# Patient Record
Sex: Male | Born: 1975 | Race: White | Hispanic: No
Health system: Southern US, Academic
[De-identification: ages and names within clinical notes are randomized; demographics above are authoritative.]

## PROBLEM LIST (undated history)

## (undated) ENCOUNTER — Ambulatory Visit

## (undated) ENCOUNTER — Telehealth

## (undated) ENCOUNTER — Ambulatory Visit
Attending: Pharmacist Clinician (PhC)/ Clinical Pharmacy Specialist | Primary: Pharmacist Clinician (PhC)/ Clinical Pharmacy Specialist

## (undated) ENCOUNTER — Ambulatory Visit: Attending: Family | Primary: Family

## (undated) DIAGNOSIS — S80819A Abrasion, unspecified lower leg, initial encounter: Secondary | ICD-10-CM

## (undated) DIAGNOSIS — M1990 Unspecified osteoarthritis, unspecified site: Secondary | ICD-10-CM

## (undated) DIAGNOSIS — F419 Anxiety disorder, unspecified: Secondary | ICD-10-CM

## (undated) DIAGNOSIS — Z98811 Dental restoration status: Secondary | ICD-10-CM

## (undated) DIAGNOSIS — F329 Major depressive disorder, single episode, unspecified: Secondary | ICD-10-CM

## (undated) DIAGNOSIS — F32A Depression, unspecified: Secondary | ICD-10-CM

## (undated) DIAGNOSIS — K219 Gastro-esophageal reflux disease without esophagitis: Secondary | ICD-10-CM

## (undated) DIAGNOSIS — I998 Other disorder of circulatory system: Secondary | ICD-10-CM

## (undated) DIAGNOSIS — F319 Bipolar disorder, unspecified: Secondary | ICD-10-CM

## (undated) DIAGNOSIS — S62609K Fracture of unspecified phalanx of unspecified finger, subsequent encounter for fracture with nonunion: Secondary | ICD-10-CM

## (undated) DIAGNOSIS — Z972 Presence of dental prosthetic device (complete) (partial): Secondary | ICD-10-CM

## (undated) HISTORY — PX: ORIF ANKLE FRACTURE: SUR919

---
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
## 2011-08-30 ENCOUNTER — Encounter (HOSPITAL_COMMUNITY): Payer: Self-pay | Admitting: Certified Registered Nurse Anesthetist

## 2011-08-30 ENCOUNTER — Inpatient Hospital Stay (HOSPITAL_COMMUNITY)
Admission: EM | Admit: 2011-08-30 | Discharge: 2011-09-03 | DRG: 902 | Disposition: A | Discharge status: Home / Self Care | Payer: Worker's Compensation | Attending: Orthopedic Surgery | Admitting: Orthopedic Surgery

## 2011-08-30 ENCOUNTER — Encounter (HOSPITAL_COMMUNITY)
Admission: EM | Disposition: A | Discharge status: Home / Self Care | Payer: Self-pay | Source: Home / Self Care | Attending: Orthopedic Surgery

## 2011-08-30 ENCOUNTER — Encounter (HOSPITAL_COMMUNITY): Payer: Self-pay | Admitting: *Deleted

## 2011-08-30 ENCOUNTER — Emergency Department (HOSPITAL_COMMUNITY): Payer: Worker's Compensation | Admitting: Certified Registered Nurse Anesthetist

## 2011-08-30 ENCOUNTER — Emergency Department (HOSPITAL_COMMUNITY): Payer: Worker's Compensation

## 2011-08-30 DIAGNOSIS — IMO0002 Reserved for concepts with insufficient information to code with codable children: Secondary | ICD-10-CM | POA: Diagnosis present

## 2011-08-30 DIAGNOSIS — S62639B Displaced fracture of distal phalanx of unspecified finger, initial encounter for open fracture: Secondary | ICD-10-CM | POA: Diagnosis present

## 2011-08-30 DIAGNOSIS — Y9269 Other specified industrial and construction area as the place of occurrence of the external cause: Secondary | ICD-10-CM

## 2011-08-30 DIAGNOSIS — F17200 Nicotine dependence, unspecified, uncomplicated: Secondary | ICD-10-CM | POA: Diagnosis present

## 2011-08-30 DIAGNOSIS — S6710XA Crushing injury of unspecified finger(s), initial encounter: Principal | ICD-10-CM | POA: Diagnosis present

## 2011-08-30 DIAGNOSIS — W240XXA Contact with lifting devices, not elsewhere classified, initial encounter: Secondary | ICD-10-CM | POA: Diagnosis present

## 2011-08-30 DIAGNOSIS — S6720XA Crushing injury of unspecified hand, initial encounter: Secondary | ICD-10-CM

## 2011-08-30 HISTORY — PX: AMPUTATION: SHX166

## 2011-08-30 HISTORY — PX: I & D EXTREMITY: SHX5045

## 2011-08-30 HISTORY — PX: LACERATION REPAIR: SHX5284

## 2011-08-30 HISTORY — PX: ARTERY REPAIR: SHX5117

## 2011-08-30 SURGERY — REPAIR, LACERATION, 2 OR MORE
Anesthesia: General | Site: Hand | Laterality: Right | Wound class: Contaminated

## 2011-08-30 MED ORDER — HYDROMORPHONE HCL PF 1 MG/ML IJ SOLN
1.0000 mg | Freq: Once | INTRAMUSCULAR | Status: AC
  Administered 2011-08-30: 1 mg via INTRAVENOUS
  Filled 2011-08-30: qty 1

## 2011-08-30 MED ORDER — PROPOFOL 10 MG/ML IV EMUL
INTRAVENOUS | Status: DC | PRN
  Administered 2011-08-30: 200 mg via INTRAVENOUS

## 2011-08-30 MED ORDER — CEFAZOLIN SODIUM 1-5 GM-% IV SOLN
1.0000 g | Freq: Once | INTRAVENOUS | Status: AC
  Administered 2011-08-30: 1 g via INTRAVENOUS
  Administered 2011-08-30: 2 g via INTRAVENOUS
  Filled 2011-08-30: qty 50

## 2011-08-30 MED ORDER — MIDAZOLAM HCL 5 MG/5ML IJ SOLN
INTRAMUSCULAR | Status: DC | PRN
  Administered 2011-08-30 (×2): 1 mg via INTRAVENOUS

## 2011-08-30 MED ORDER — SUCCINYLCHOLINE CHLORIDE 20 MG/ML IJ SOLN
INTRAMUSCULAR | Status: DC | PRN
  Administered 2011-08-30: 100 mg via INTRAVENOUS

## 2011-08-30 MED ORDER — HYDROMORPHONE HCL PF 1 MG/ML IJ SOLN
1.0000 mg | Freq: Once | INTRAMUSCULAR | Status: AC
  Administered 2011-08-30: 1 mg via INTRAVENOUS

## 2011-08-30 MED ORDER — BUPIVACAINE HCL (PF) 0.25 % IJ SOLN
INTRAMUSCULAR | Status: AC
  Filled 2011-08-30: qty 30

## 2011-08-30 MED ORDER — LACTATED RINGERS IV SOLN
INTRAVENOUS | Status: DC | PRN
  Administered 2011-08-30 – 2011-08-31 (×2): via INTRAVENOUS

## 2011-08-30 MED ORDER — FENTANYL CITRATE 0.05 MG/ML IJ SOLN
INTRAMUSCULAR | Status: DC | PRN
  Administered 2011-08-30 (×2): 100 ug via INTRAVENOUS
  Administered 2011-08-30 – 2011-08-31 (×2): 50 ug via INTRAVENOUS
  Administered 2011-08-31: 100 ug via INTRAVENOUS
  Administered 2011-08-31 (×5): 50 ug via INTRAVENOUS

## 2011-08-30 MED ORDER — TETANUS-DIPHTH-ACELL PERTUSSIS 5-2.5-18.5 LF-MCG/0.5 IM SUSP
0.5000 mL | Freq: Once | INTRAMUSCULAR | Status: AC
  Administered 2011-08-30: 0.5 mL via INTRAMUSCULAR
  Filled 2011-08-30: qty 0.5

## 2011-08-30 MED ORDER — HYDROMORPHONE HCL PF 1 MG/ML IJ SOLN
INTRAMUSCULAR | Status: AC
  Filled 2011-08-30: qty 1

## 2011-08-30 MED ORDER — LIDOCAINE HCL (CARDIAC) 20 MG/ML IV SOLN
INTRAVENOUS | Status: DC | PRN
  Administered 2011-08-30: 100 mg via INTRAVENOUS

## 2011-08-30 SURGICAL SUPPLY — 75 items
BANDAGE COBAN STERILE 2 (GAUZE/BANDAGES/DRESSINGS) IMPLANT
BANDAGE CONFORM 2  STR LF (GAUZE/BANDAGES/DRESSINGS) IMPLANT
BANDAGE ELASTIC 3 VELCRO ST LF (GAUZE/BANDAGES/DRESSINGS) ×4 IMPLANT
BANDAGE ELASTIC 4 VELCRO ST LF (GAUZE/BANDAGES/DRESSINGS) ×4 IMPLANT
BANDAGE GAUZE ELAST BULKY 4 IN (GAUZE/BANDAGES/DRESSINGS) ×4 IMPLANT
BENZOIN TINCTURE PRP APPL 2/3 (GAUZE/BANDAGES/DRESSINGS) ×4 IMPLANT
BLADE SURG ROTATE 9660 (MISCELLANEOUS) IMPLANT
BNDG COHESIVE 1X5 TAN STRL LF (GAUZE/BANDAGES/DRESSINGS) IMPLANT
BNDG ESMARK 4X9 LF (GAUZE/BANDAGES/DRESSINGS) ×4 IMPLANT
CLOTH BEACON ORANGE TIMEOUT ST (SAFETY) ×4 IMPLANT
CORDS BIPOLAR (ELECTRODE) ×4 IMPLANT
COVER SURGICAL LIGHT HANDLE (MISCELLANEOUS) ×4 IMPLANT
CUFF TOURNIQUET SINGLE 18IN (TOURNIQUET CUFF) ×4 IMPLANT
CUFF TOURNIQUET SINGLE 24IN (TOURNIQUET CUFF) IMPLANT
DECANTER SPIKE VIAL GLASS SM (MISCELLANEOUS) ×4 IMPLANT
DRAIN PENROSE 1/4X12 LTX STRL (WOUND CARE) IMPLANT
DRAPE C-ARM MINI 42X72 WSTRAPS (DRAPES) ×4 IMPLANT
DRAPE MICROSCOPE LEICA (MISCELLANEOUS) ×4 IMPLANT
DRAPE OEC MINIVIEW 54X84 (DRAPES) ×4 IMPLANT
DRAPE SURG 17X23 STRL (DRAPES) ×4 IMPLANT
DRSG ADAPTIC 3X8 NADH LF (GAUZE/BANDAGES/DRESSINGS) IMPLANT
DRSG EMULSION OIL 3X3 NADH (GAUZE/BANDAGES/DRESSINGS) ×4 IMPLANT
DRSG PAD ABDOMINAL 8X10 ST (GAUZE/BANDAGES/DRESSINGS) ×8 IMPLANT
DURAPREP 26ML APPLICATOR (WOUND CARE) IMPLANT
GAUZE SPONGE 4X4 16PLY XRAY LF (GAUZE/BANDAGES/DRESSINGS) ×4 IMPLANT
GAUZE XEROFORM 1X8 LF (GAUZE/BANDAGES/DRESSINGS) ×4 IMPLANT
GAUZE XEROFORM 5X9 LF (GAUZE/BANDAGES/DRESSINGS) ×4 IMPLANT
GLOVE BIO SURGEON STRL SZ 6.5 (GLOVE) ×16 IMPLANT
GLOVE BIO SURGEON STRL SZ7.5 (GLOVE) ×8 IMPLANT
GLOVE BIOGEL PI IND STRL 7.0 (GLOVE) ×6 IMPLANT
GLOVE BIOGEL PI IND STRL 8 (GLOVE) ×3 IMPLANT
GLOVE BIOGEL PI INDICATOR 7.0 (GLOVE) ×2
GLOVE BIOGEL PI INDICATOR 8 (GLOVE) ×1
GOWN STRL NON-REIN LRG LVL3 (GOWN DISPOSABLE) ×12 IMPLANT
GOWN STRL REIN XL XLG (GOWN DISPOSABLE) ×4 IMPLANT
HANDPIECE INTERPULSE COAX TIP (DISPOSABLE)
KIT BASIN OR (CUSTOM PROCEDURE TRAY) ×4 IMPLANT
KIT ROOM TURNOVER OR (KITS) ×4 IMPLANT
KWIRE 4.0 X .035IN (WIRE) ×4 IMPLANT
LOOP VESSEL MAXI BLUE (MISCELLANEOUS) IMPLANT
LOOP VESSEL MINI RED (MISCELLANEOUS) IMPLANT
MANIFOLD NEPTUNE II (INSTRUMENTS) ×4 IMPLANT
NEEDLE HYPO 25GX1X1/2 BEV (NEEDLE) ×4 IMPLANT
NEEDLE HYPO 25X1 1.5 SAFETY (NEEDLE) ×4 IMPLANT
NS IRRIG 1000ML POUR BTL (IV SOLUTION) ×4 IMPLANT
PACK ORTHO EXTREMITY (CUSTOM PROCEDURE TRAY) ×4 IMPLANT
PAD ARMBOARD 7.5X6 YLW CONV (MISCELLANEOUS) ×8 IMPLANT
PADDING CAST ABS 4INX4YD NS (CAST SUPPLIES) ×1
PADDING CAST ABS COTTON 4X4 ST (CAST SUPPLIES) ×3 IMPLANT
SCRUB BETADINE 4OZ XXX (MISCELLANEOUS) ×4 IMPLANT
SET HNDPC FAN SPRY TIP SCT (DISPOSABLE) IMPLANT
SLEEVE SURGEON STRL (DRAPES) ×4 IMPLANT
SOLUTION BETADINE 4OZ (MISCELLANEOUS) ×4 IMPLANT
SPEAR EYE SURG WECK-CEL (MISCELLANEOUS) ×32 IMPLANT
SPLINT FIBERGLASS 4X15 (CAST SUPPLIES) ×4 IMPLANT
SPONGE GAUZE 4X4 12PLY (GAUZE/BANDAGES/DRESSINGS) ×4 IMPLANT
SPONGE LAP 18X18 X RAY DECT (DISPOSABLE) IMPLANT
SPONGE LAP 4X18 X RAY DECT (DISPOSABLE) ×4 IMPLANT
STRIP CLOSURE SKIN 1/2X4 (GAUZE/BANDAGES/DRESSINGS) IMPLANT
SUCTION FRAZIER TIP 10 FR DISP (SUCTIONS) ×4 IMPLANT
SUT ETHILON 4 0 P 3 18 (SUTURE) IMPLANT
SUT ETHILON 4 0 PS 2 18 (SUTURE) IMPLANT
SUT ETHILON 9 0 BV130 4 (SUTURE) ×8 IMPLANT
SUT MON AB 5-0 P3 18 (SUTURE) ×12 IMPLANT
SUT PROLENE 4 0 P 3 18 (SUTURE) IMPLANT
SUT SILK 2 0 SH (SUTURE) ×4 IMPLANT
SYR CONTROL 10ML LL (SYRINGE) ×4 IMPLANT
TOWEL OR 17X24 6PK STRL BLUE (TOWEL DISPOSABLE) ×4 IMPLANT
TOWEL OR 17X26 10 PK STRL BLUE (TOWEL DISPOSABLE) ×4 IMPLANT
TUBE ANAEROBIC SPECIMEN COL (MISCELLANEOUS) IMPLANT
TUBE CONNECTING 12X1/4 (SUCTIONS) ×4 IMPLANT
TUBE FEEDING 5FR 15 INCH (TUBING) IMPLANT
UNDERPAD 30X30 INCONTINENT (UNDERPADS AND DIAPERS) ×4 IMPLANT
WATER STERILE IRR 1000ML POUR (IV SOLUTION) ×16 IMPLANT
YANKAUER SUCT BULB TIP NO VENT (SUCTIONS) ×4 IMPLANT

## 2011-08-30 NOTE — ED Provider Notes (Signed)
History     CSN: [id number]  Arrival date & time 08/30/11  1718   First MD Initiated Contact with Patient 08/30/11 1735      Chief Complaint  Patient presents with  . Hand Injury    (Consider location/radiation/quality/duration/timing/severity/associated sxs/prior treatment) Patient is a 36 y.o. male presenting with hand injury. The history is provided by the patient.  Hand Injury  The incident occurred less than 1 hour ago. The incident occurred at work. Injury mechanism: forklift fell on hand. The pain is present in the right fingers. The pain is at a severity of 10/10. The pain is severe. The pain has been constant since the incident. Pertinent negatives include no fever. It is unknown if a foreign body is present. The symptoms are aggravated by palpation, use and movement. He has tried nothing for the symptoms.    History reviewed. No pertinent past medical history.  History reviewed. No pertinent past surgical history.  History reviewed. No pertinent family history.  History  Substance Use Topics  . Smoking status: Current Everyday Smoker    Types: Cigarettes  . Smokeless tobacco: Not on file  . Alcohol Use:       Review of Systems  Constitutional: Negative for fever.  HENT: Negative.  Negative for neck pain.   Respiratory: Negative for shortness of breath.   Cardiovascular: Negative for chest pain.  Gastrointestinal: Negative for nausea, vomiting, abdominal pain, constipation and blood in stool.  Genitourinary: Negative for dysuria and decreased urine volume.  Musculoskeletal:       Hand injury and numbness  Skin: Positive for wound.  Neurological: Positive for numbness. Negative for headaches.  Psychiatric/Behavioral: Negative for confusion.  All other systems reviewed and are negative.    Allergies  Review of patient's allergies indicates no known allergies.  Home Medications  No current outpatient prescriptions on file.  BP 159/113  Pulse 115  Resp  22  Physical Exam  Nursing note and vitals reviewed. Constitutional: He is oriented to person, place, and time. He appears well-developed and well-nourished.  HENT:  Head: Normocephalic and atraumatic.  Right Ear: External ear normal.  Left Ear: External ear normal.  Nose: Nose normal.  Mouth/Throat: Oropharynx is clear and moist.  Neck: Neck supple.  Cardiovascular: Normal rate, regular rhythm, normal heart sounds and intact distal pulses.   Pulmonary/Chest: Effort normal.  Abdominal: Soft. He exhibits no distension. There is no tenderness.  Musculoskeletal:       Right hand: He exhibits tenderness, bony tenderness, decreased capillary refill, deformity and laceration. decreased sensation noted.       Hands: Lymphadenopathy:    He has no cervical adenopathy.  Neurological: He is alert and oriented to person, place, and time.  Skin: Skin is warm and dry.    ED Course  Procedures (including critical care time)  Labs Reviewed - No data to display Dg Hand Complete Right  08/30/2011  *RADIOLOGY REPORT*  Clinical Data: Crush injury  RIGHT HAND - COMPLETE 3+ VIEW  Comparison: None.  Findings: Comminuted fracture of the middle phalanx index finger with extension to the proximal and distal articular surfaces, minimal distraction.  Comminuted fracture of the middle phalanx long finger with extension to the proximal articular   and distal articular surfaces, and mild distraction of fracture fragments.  Comminuted fracture of the middle phalanx right ring finger with extension to the distal articular surface and mild distraction of fracture fragments  Comminuted fracture of the distal phalanx right little finger with several  millimeters distraction of fracture fragments, extension proximally to the articular surface, and overlying soft tissue injury suggesting open fracture.  The more proximal bones through the carpus are unremarkable with normal mineralization and alignment.  No significant  degenerative change.  IMPRESSION:  1.  Phalangeal fractures as detailed above.  Original Report Authenticated By: Thora Lance III, M.D.     1. Crush injury to hand       MDM  Forklift fell and hit right hand. No other injuries. 5th and 4th digit partial amputations. 3rd, 4th fingers dusky with poor cap refill. Given tdap, ancef, dilaudid. Xray shows fractures of 4 digits from crush injury. Ortho consulted. Placed in betadine due to back up of OR, eventually went to OR with ortho        Pricilla Loveless, MD 08/30/11 2324

## 2011-08-30 NOTE — H&P (Signed)
  [name] [name] is an 36 y.o. male.   Chief Complaint: right hand crush injury HPI: 36 yo rhd male states he was at work today when a fork from a fork lift fell onto his right hand injuring his fingers.  Wounds on index, long, ring, and small fingers.  Presented at [facility].  Reports previous injury to thumb and no other injuries today.  Smokes 1 ppd x 15+ years.  History reviewed. No pertinent past medical history.  History reviewed. No pertinent past surgical history.  History reviewed. No pertinent family history. Social History:  reports that he has been smoking Cigarettes.  He does not have any smokeless tobacco history on file. His alcohol and drug histories not on file.  Allergies: No Known Allergies   (Not in a hospital admission)  No results found for this or any previous visit (from the past 48 hour(s)).  Dg Hand Complete Right  08/30/2011  *RADIOLOGY REPORT*  Clinical Data: Crush injury  RIGHT HAND - COMPLETE 3+ VIEW  Comparison: None.  Findings: Comminuted fracture of the middle phalanx index finger with extension to the proximal and distal articular surfaces, minimal distraction.  Comminuted fracture of the middle phalanx long finger with extension to the proximal articular   and distal articular surfaces, and mild distraction of fracture fragments.  Comminuted fracture of the middle phalanx right ring finger with extension to the distal articular surface and mild distraction of fracture fragments  Comminuted fracture of the distal phalanx right little finger with several millimeters distraction of fracture fragments, extension proximally to the articular surface, and overlying soft tissue injury suggesting open fracture.  The more proximal bones through the carpus are unremarkable with normal mineralization and alignment.  No significant degenerative change.  IMPRESSION:  1.  Phalangeal fractures as detailed above.  Original Report Authenticated By: Osa Craver, M.D.     A  comprehensive review of systems was negative.  Blood pressure 155/88, pulse 70, temperature 98.9 F (37.2 C), temperature source Oral, resp. rate 16, SpO2 100.00%.  General appearance: alert, cooperative and appears stated age Head: Normocephalic, without obvious abnormality, atraumatic Neck: supple, symmetrical, trachea midline Resp: clear to auscultation bilaterally Cardio: regular rate and rhythm GI: soft, non-tender; bowel sounds normal; no masses,  no organomegaly Extremities: left ue: light touch sensation and capillary refill intact all digits.  +epl/fpl/io.  right ue: wounds on index, long, ring, small fingers.  decreased sensation i/l/r/s.  intact capillary refill thumb and index.  long, ring, small with bluish discoloration.  small finger nearly amputated at dip joint.  unable to test finger flexors/extensors secondary to pain. Pulses: 2+ and symmetric Skin: as above Neurologic: Grossly normal as above Incision/Wound: As above  Assessment/Plan Right index, long, ring, small finger crush injuries.  Discussed with patient and family/friends present the nature of the injury.  Recommend OR for I&D of fractures and exploration of wounds with repair of structures if indicated.  Discussed that small finger will likely require revision amputation and that index, long, and ring fingers are at risk for future amputation due to poor vascular perfusion with crush injury and smoking history.  Risks, benefits, and alternatives of surgery were discussed and the patient agrees with the plan of care.   Benicia Bergevin R 08/30/2011, 9:13 PM

## 2011-08-30 NOTE — ED Notes (Signed)
To ED after having a forklift fork fall onto pt's right hand and wrist. Pt's glove remains. Pt states he is unable to feel fingers. States he is unable to move digits.

## 2011-08-30 NOTE — ED Notes (Signed)
Fork lift fell on hand today at work. Patient waiting on hand surgeon

## 2011-08-30 NOTE — Anesthesia Preprocedure Evaluation (Addendum)
Anesthesia Evaluation  Patient identified by MRN, date of birth, ID band Patient awake    Reviewed: Allergy & Precautions, H&P , NPO status , Patient's Chart, lab work & pertinent test results  History of Anesthesia Complications Negative for: history of anesthetic complications  Airway Mallampati: I TM Distance: >3 FB Neck ROM: Full    Dental  (+) Teeth Intact and Dental Advisory Given   Pulmonary Current Smoker,  1 ppd  breath sounds clear to auscultation        Cardiovascular Rhythm:Regular Rate:Normal     Neuro/Psych    GI/Hepatic   Endo/Other    Renal/GU      Musculoskeletal   Abdominal   Peds  Hematology   Anesthesia Other Findings   Reproductive/Obstetrics                          Anesthesia Physical Anesthesia Plan  ASA: II and Emergent  Anesthesia Plan: General   Post-op Pain Management:    Induction: Intravenous, Rapid sequence and Cricoid pressure planned  Airway Management Planned: Oral ETT  Additional Equipment:   Intra-op Plan:   Post-operative Plan: Extubation in OR  Informed Consent: I have reviewed the patients History and Physical, chart, labs and discussed the procedure including the risks, benefits and alternatives for the proposed anesthesia with the patient or authorized representative who has indicated his/her understanding and acceptance.   Dental advisory given  Plan Discussed with: CRNA, Anesthesiologist and Surgeon  Anesthesia Plan Comments:        Anesthesia Quick Evaluation

## 2011-08-30 NOTE — Preoperative (Signed)
Beta Blockers   Reason not to administer Beta Blockers:Not Applicable,  

## 2011-08-30 NOTE — ED Notes (Addendum)
Pt was at work and Present[employer] fell onto left hand.  Upon arriival pt had a work glove on hand that was removed.  1st joint on left little finger was only attached by minimal tissue.  Pt states he can feel fingers.  Third and middle finger of left hand have several lacerations but bleeding is controlled on all fingers. The middle finger appears slightly discolored.   Pt reports 10/10 pain.  Pt alert oriented X4

## 2011-08-31 ENCOUNTER — Encounter (HOSPITAL_COMMUNITY): Payer: Self-pay | Admitting: General Practice

## 2011-08-31 MED ORDER — METHOCARBAMOL 100 MG/ML IJ SOLN
500.0000 mg | Freq: Four times a day (QID) | INTRAVENOUS | Status: DC | PRN
  Filled 2011-08-31: qty 5

## 2011-08-31 MED ORDER — LACTATED RINGERS IV SOLN
INTRAVENOUS | Status: DC
  Administered 2011-08-31 (×2): via INTRAVENOUS

## 2011-08-31 MED ORDER — METOCLOPRAMIDE HCL 10 MG PO TABS: 5.0000 mg | ORAL_TABLET | Freq: Three times a day (TID) | ORAL | Status: DC | PRN

## 2011-08-31 MED ORDER — OXYCODONE-ACETAMINOPHEN 5-325 MG PO TABS: ORAL_TABLET | ORAL | Status: DC

## 2011-08-31 MED ORDER — ONDANSETRON HCL 4 MG/2ML IJ SOLN: 4.0000 mg | Freq: Four times a day (QID) | INTRAMUSCULAR | Status: DC | PRN

## 2011-08-31 MED ORDER — DIPHENHYDRAMINE HCL 12.5 MG/5ML PO ELIX: 12.5000 mg | ORAL_SOLUTION | ORAL | Status: DC | PRN

## 2011-08-31 MED ORDER — ONDANSETRON HCL 4 MG/2ML IJ SOLN: 4.0000 mg | Freq: Once | INTRAMUSCULAR | Status: DC | PRN

## 2011-08-31 MED ORDER — CEFAZOLIN SODIUM 1-5 GM-% IV SOLN
1.0000 g | Freq: Four times a day (QID) | INTRAVENOUS | Status: AC
  Administered 2011-08-31 (×3): 1 g via INTRAVENOUS
  Filled 2011-08-31 (×3): qty 50

## 2011-08-31 MED ORDER — ASPIRIN EC 325 MG PO TBEC: 325.0000 mg | DELAYED_RELEASE_TABLET | Freq: Every day | ORAL | Status: AC

## 2011-08-31 MED ORDER — HYDROMORPHONE HCL PF 1 MG/ML IJ SOLN: 0.2500 mg | INTRAMUSCULAR | Status: DC | PRN

## 2011-08-31 MED ORDER — KETOROLAC TROMETHAMINE 30 MG/ML IJ SOLN
INTRAMUSCULAR | Status: AC
  Filled 2011-08-31: qty 1

## 2011-08-31 MED ORDER — SODIUM CHLORIDE 0.9 % IR SOLN
Status: DC | PRN
  Administered 2011-08-31: 1000 mL

## 2011-08-31 MED ORDER — ASPIRIN 325 MG PO TABS
325.0000 mg | ORAL_TABLET | Freq: Every day | ORAL | Status: DC
  Administered 2011-08-31 – 2011-09-02 (×3): 325 mg via ORAL
  Filled 2011-08-31 (×4): qty 1

## 2011-08-31 MED ORDER — METOCLOPRAMIDE HCL 5 MG/ML IJ SOLN: 5.0000 mg | Freq: Three times a day (TID) | INTRAMUSCULAR | Status: DC | PRN

## 2011-08-31 MED ORDER — HYDROMORPHONE HCL PF 1 MG/ML IJ SOLN
0.5000 mg | INTRAMUSCULAR | Status: DC | PRN
  Administered 2011-08-31 (×5): 1 mg via INTRAVENOUS
  Filled 2011-08-31 (×5): qty 1

## 2011-08-31 MED ORDER — SULFAMETHOXAZOLE-TRIMETHOPRIM 800-160 MG PO TABS: 1.0000 | ORAL_TABLET | Freq: Two times a day (BID) | ORAL | Status: AC

## 2011-08-31 MED ORDER — ONDANSETRON HCL 4 MG PO TABS: 4.0000 mg | ORAL_TABLET | Freq: Four times a day (QID) | ORAL | Status: DC | PRN

## 2011-08-31 MED ORDER — KETOROLAC TROMETHAMINE 15 MG/ML IJ SOLN
15.0000 mg | Freq: Four times a day (QID) | INTRAMUSCULAR | Status: AC
  Administered 2011-08-31 – 2011-09-01 (×2): 15 mg via INTRAVENOUS
  Filled 2011-08-31 (×3): qty 1

## 2011-08-31 MED ORDER — ENOXAPARIN SODIUM 40 MG/0.4ML ~~LOC~~ SOLN
40.0000 mg | SUBCUTANEOUS | Status: DC
  Administered 2011-08-31 – 2011-09-01 (×2): 40 mg via SUBCUTANEOUS
  Filled 2011-08-31 (×4): qty 0.4

## 2011-08-31 MED ORDER — MORPHINE SULFATE 4 MG/ML IJ SOLN
4.0000 mg | INTRAMUSCULAR | Status: DC | PRN
  Administered 2011-08-31 – 2011-09-01 (×4): 6 mg via INTRAVENOUS
  Filled 2011-08-31 (×3): qty 2

## 2011-08-31 MED ORDER — MORPHINE SULFATE 2 MG/ML IJ SOLN
INTRAMUSCULAR | Status: AC
  Administered 2011-08-31: 6 mg via INTRAVENOUS
  Filled 2011-08-31: qty 3

## 2011-08-31 MED ORDER — OXYCODONE-ACETAMINOPHEN 5-325 MG PO TABS
1.0000 | ORAL_TABLET | ORAL | Status: DC | PRN
  Administered 2011-08-31 – 2011-09-01 (×5): 2 via ORAL
  Filled 2011-08-31 (×5): qty 2

## 2011-08-31 MED ORDER — BUPIVACAINE HCL (PF) 0.25 % IJ SOLN
INTRAMUSCULAR | Status: DC | PRN
  Administered 2011-08-31: 10 mL

## 2011-08-31 MED ORDER — TEMAZEPAM 15 MG PO CAPS
15.0000 mg | ORAL_CAPSULE | Freq: Every evening | ORAL | Status: DC | PRN
  Administered 2011-08-31: 15 mg via ORAL
  Administered 2011-09-01: 30 mg via ORAL
  Administered 2011-09-01: 15 mg via ORAL
  Administered 2011-09-02: 30 mg via ORAL
  Filled 2011-08-31: qty 1
  Filled 2011-08-31: qty 2
  Filled 2011-08-31: qty 1
  Filled 2011-08-31: qty 2

## 2011-08-31 MED ORDER — METHOCARBAMOL 500 MG PO TABS
500.0000 mg | ORAL_TABLET | Freq: Four times a day (QID) | ORAL | Status: DC | PRN
  Administered 2011-08-31 – 2011-09-03 (×9): 500 mg via ORAL
  Filled 2011-08-31 (×9): qty 1

## 2011-08-31 MED ORDER — ONDANSETRON HCL 4 MG/2ML IJ SOLN
INTRAMUSCULAR | Status: DC | PRN
  Administered 2011-08-31: 4 mg via INTRAVENOUS

## 2011-08-31 NOTE — Anesthesia Procedure Notes (Signed)
Procedure Name: Intubation Date/Time: 08/30/2011 11:42 PM Performed by: Rossie Muskrat L Pre-anesthesia Checklist: Patient identified, Timeout performed, Emergency Drugs available, Suction available and Patient being monitored Patient Re-evaluated:Patient Re-evaluated prior to inductionOxygen Delivery Method: Circle system utilized Preoxygenation: Pre-oxygenation with 100% oxygen Intubation Type: IV induction, Cricoid Pressure applied and Rapid sequence Ventilation: Mask ventilation without difficulty Laryngoscope Size: Miller and 2 Grade View: Grade I Tube type: Oral Tube size: 7.5 mm Number of attempts: 1 Airway Equipment and Method: Stylet Placement Confirmation: ETT inserted through vocal cords under direct vision,  breath sounds checked- equal and bilateral and positive ETCO2 Secured at: 22 cm Tube secured with: Tape Dental Injury: Teeth and Oropharynx as per pre-operative assessment

## 2011-08-31 NOTE — Transfer of Care (Signed)
Immediate Anesthesia Transfer of Care Note  Patient: [name] [name]  Procedure(s) Performed: Procedure(s) (LRB): IRRIGATION AND DEBRIDEMENT EXTREMITY (Right) AMPUTATION DIGIT (Right) ARTERY REPAIR (Right) REPAIR MULTIPLE LACERATIONS (Right)  Patient Location: PACU  Anesthesia Type: General  Level of Consciousness: awake, alert  and oriented  Airway & Oxygen Therapy: Patient Spontanous Breathing and Patient connected to nasal cannula oxygen  Post-op Assessment: Report given to PACU RN, Post -op Vital signs reviewed and stable and Patient moving all extremities  Post vital signs: Reviewed and stable  Complications: No apparent anesthesia complications

## 2011-08-31 NOTE — Progress Notes (Signed)
Utilization review completed. Yanisa Goodgame, RN, BSN. 

## 2011-08-31 NOTE — Op Note (Signed)
Dictation [id number]

## 2011-08-31 NOTE — Progress Notes (Signed)
Subjective: 1 Day Post-Op Procedure(s) (LRB): IRRIGATION AND DEBRIDEMENT EXTREMITY (Right) AMPUTATION DIGIT (Right) ARTERY REPAIR (Right) REPAIR MULTIPLE LACERATIONS (Right) Patient reports pain as mild.    Objective: Vital signs in last 24 hours: Temp:  [97.2 F (36.2 C)-99 F (37.2 C)] 98.4 F (36.9 C) (08/07 0553) Pulse Rate:  [70-115] 71  (08/07 0553) Resp:  [16-22] 17  (08/07 0553) BP: (131-159)/(84-113) 131/85 mmHg (08/07 0553) SpO2:  [98 %-100 %] 100 % (08/07 0553)  Intake/Output from previous day: 08/06 0701 - 08/07 0700 In: 1600 [I.V.:1600] Out: 75 [Blood:75] Intake/Output this shift:    No results found for this basename: HGB:5 in the last 72 hours No results found for this basename: WBC:2,RBC:2,HCT:2,PLT:2 in the last 72 hours No results found for this basename: NA:2,K:2,CL:2,CO2:2,BUN:2,CREATININE:2,GLUCOSE:2,CALCIUM:2 in the last 72 hours No results found for this basename: LABPT:2,INR:2 in the last 72 hours  splint intact.  capillary refill index, long fingers.  poor capillary refill in ring.  thumb with intact sensation and capillary refill. poor sensation index, long, ring.    Assessment/Plan: 1 Day Post-Op Procedure(s) (LRB): IRRIGATION AND DEBRIDEMENT EXTREMITY (Right) AMPUTATION DIGIT (Right) ARTERY REPAIR (Right) REPAIR MULTIPLE LACERATIONS (Right) Discharge home.  Follow up one week.  Geoffry Bannister R 08/31/2011, 12:20 PM

## 2011-08-31 NOTE — Anesthesia Postprocedure Evaluation (Signed)
  Anesthesia Post-op Note  Patient: [name] [name]  Procedure(s) Performed: Procedure(s) (LRB): IRRIGATION AND DEBRIDEMENT EXTREMITY (Right) AMPUTATION DIGIT (Right) ARTERY REPAIR (Right) REPAIR MULTIPLE LACERATIONS (Right)  Patient Location: PACU  Anesthesia Type: General  Level of Consciousness: awake, alert  and oriented  Airway and Oxygen Therapy: Patient Spontanous Breathing and Patient connected to nasal cannula oxygen  Post-op Pain: none  Post-op Assessment: Post-op Vital signs reviewed  Post-op Vital Signs: Reviewed  Complications: No apparent anesthesia complications

## 2011-08-31 NOTE — Op Note (Signed)
NAME:  [name] [name], [name] [name] NO.:  [id number]  MEDICAL RECORD NO.:  [id number]  LOCATION:  5N02C                        FACILITY:  [facility]  PHYSICIAN:  Betha Loa, MD        DATE OF BIRTH:  [birth year]  DATE OF PROCEDURE:  08/30/2011 DATE OF DISCHARGE:                              OPERATIVE REPORT   PREOPERATIVE DIAGNOSIS:  Crush injury to right index, long, ring, and small fingers.  POSTOPERATIVE DIAGNOSIS:  Crush injury to right index, long, ring, and small fingers with radial and ulnar digital artery laceration to ring finger.  PROCEDURE:   1. Irrigation and debridement of open fractures of index, long,  ring, and small fingers 2. Revision amputation small finger 3. Closure of wounds index, long, and ring fingers 4. Repair of ring finger radial digital artery under microscope 5. Repair of ring finger ulnar digital artery under microscope  SURGEON:  Betha Loa, MD  ASSISTANT:  None.  ANESTHESIA:  General.  IV FLUIDS:  Per anesthesia flow sheet.  ESTIMATED BLOOD LOSS:  25 mL.  SPECIMENS:  None.  TOURNIQUET TIME:  None.  DISPOSITION:  Stable to PACU.  INDICATIONS:  [name] [name] is a 36 year old right-hand-dominant male who at work yesterday afternoon had a fork onto his right hand.  He presented to [facility] Department where radiographs were taken revealing fractures of the index, long, ring, and small fingers with open wounds of all the fingers.  I was consulted for management of the injuries.  On evaluation, he had decreased sensation in his fingertips. In preoperative holding, he had improved sensation in the index and long finger tips.  The small finger was hanging on by couple of pieces of tissue.  The long and ring fingers were blue in coloration distally.  I recommend [name] [name] going to the operating room for irrigation and debridement of the open fractures with potential percutaneous pinning exploration of the wounds and potential vascular  repair necessary and revision amputation small finger.  Risks, benefits, and alternatives of surgery were discussed including the risk of blood loss, infection, damage to nerves, vessels, tendons, ligaments, bone, failure of procedure, need for additional procedures, complications with wound healing, continued pain, nonunion, malunion, stiffness, and potential need for revision amputation at a later date.  He voiced understanding of these risks and elected to proceed.  Surgical consent was signed.  OPERATIVE COURSE:  Operative treatment of the patient was delayed due to  Other emergent operative cases and OR backlog.  After being identified  preoperatively by myself, the patient and I agreed upon procedure and site of procedure.  Surgical site was marked.  The risks, benefits, and alternatives of surgery were reviewed and he wished to proceed.  Surgical consent had been signed. He had been given IV Ancef and his tetanus updated in the emergency department.  His Ancef was redosed.  He was transported to the operating room and placed on the operating room table in a supine position with the right upper extremity on arm board.  General anesthesia was induced by anesthesiologist.  The right upper extremity was prepped and draped in normal sterile orthopedic fashion.  Surgical pause was performed between surgeons, anesthesia, and operative staff and all were in agreement as to the patient, procedure, and site of procedure. Tourniquet was placed at the proximal aspect of the arm, but never inflated.  The wounds were all inspected.  The wound at the volar aspect of the index finger was inspected.  The flexor tendons were identified and were intact.  The ulnar digital neurovascular bundle was identified and both the nerve and artery were intact.  The radial neurovascular bundle was outside the zone of injury.  The long finger was examined. Again, the flexor tendons were identified and were  intact.  There was damage to the lateral band on the ulnar side.  Again, the radial and ulnar digital neurovascular bundles were identified and both the artery and nerve on each side were intact.  The ring finger was examined.  The tendons were intact.  Again, the lateral bands were damaged, on the radial  side.  The digital nerve on both radial and ulnar side were intact.  The digital artery on the radial and ulnar sides were lacerated.  The small finger was examined and was hanging on by only the digital nerve and artery.  It was without capillary refill distally.  It was wrapped in a warm moist gauze sponge for 10 minutes and then reexamined and again there was no capillary refill.  All the open fractures were irrigated with 2000 mL of sterile saline and debrided. The bone on the ring finger had some small flecks of black on it that were removed as best possible.  There was otherwise no gross contamination noted.  A revision amputation was performed of the small finger through the DIP joint.  The nerve and artery on each side was cauterized with bipolar electrocautery.  The soft tissues were repaired with 5-0 Monocryl in an interrupted fashion.  The index and long finger soft tissue wounds were repaired again with 5-0 Monocryl in interrupted fashion.  The wounds were closed loosely.  There was a long wound on the dorsum of the index finger that again was irrigated and loosely closed. The ring finger was addressed.  The microscope was brought in.  The ulnar digital artery was addressed first.  It was cleared of clot.  The ends were freshened.  The artery was repaired in an interrupted circumferential fashion using 9-0 nylon suture.  Clamps were taken down and there was flow noted through the anastomosis.  It was pulsatile. Attention was turned to the radial digital artery.  Again lumen was cleared of clot and the ends freshened.  9-0 nylon suture was used in an interrupted  circumferential fashion to repair the arterial ends together.  Once the proximal clamp was removed, there was a single hole in the distal aspect of the artery where there was a branch point.  This was closed using a suture.  There was good flow across the anastomosis site.  The fingertip had better turgor, but still had not pinked up. The wounds were all again copiously irrigated with sterile saline.  The remaining wounds in the ring finger were repaired with 5-0 Monocryl suture.  C-arm had been used prior to repairs to examine the fractures. They were reduced manually.  It was felt that there was too much comminution to allow any type of fixation.  The wounds were all dressed with sterile Xeroform.  Digital blocks were performed with 10 mL of 0.25% plain Marcaine to aid in postoperative analgesia.  The wounds were  dressed with sterile Xeroform, 4 x 4s, and wrapped with Kerlix.  A volar and dorsal slab splint was placed and wrapped with Kerlix and Ace bandage.  The fingertips had capillary refill except for the ring finger that had slow refill, but had better turgor and color than preoperatively.  The operative drapes were broken down.  The patient was awoken from anesthesia safely.  He was transferred back to the stretcher and taken to PACU in stable condition. He will be kept overnight for pain control and observation.  We will anticipate allowing him to go home tomorrow.  He will be continued on IV antibiotics and aspirin started.     Betha Loa, MD     KK/MEDQ  D:  08/31/2011  T:  08/31/2011  Job:  [id number]

## 2011-08-31 NOTE — Progress Notes (Signed)
Orthopedic Tech Progress Note Patient Details:  [name] [name] January 08, 1976 [id number]  Ortho Devices Type of Ortho Device: Other (comment) (kuzman sling ) Ortho Device/Splint Location: (R) UE Ortho Device/Splint Interventions: Application   Jennye Moccasin 08/31/2011, 5:03 PM

## 2011-08-31 NOTE — ED Provider Notes (Signed)
I saw and evaluated the patient, reviewed the resident's note and I agree with the findings and plan.  Pt with crush injury to the hand. Pt's exam reveals partial amputation of the right small digit. All of his digits besides the thumb seem to have been injured. There is a definite sensory deficit, and the distal tips appears dusky is mostly all digits. Hand surgeon notified immediately, Ancef, TDAP and wound management started in the ED, with definite OR intervention required to assess neurovascular injuries. Pt is right handed.  Derwood Kaplan, MD 08/31/11 0104

## 2011-08-31 NOTE — Discharge Summary (Signed)
Dictation [phone]

## 2011-08-31 NOTE — Progress Notes (Signed)
Subjective: 1 Day Post-Op Procedure(s) (LRB): IRRIGATION AND DEBRIDEMENT EXTREMITY (Right) AMPUTATION DIGIT (Right) ARTERY REPAIR (Right) REPAIR MULTIPLE LACERATIONS (Right) Patient reports pain as moderate.  Having pain issues during afternoon.  Now controlled with percocet, morphine, toradol, strict elevation.  Patient comfortable now.  States pain was located in fingers and felt like they were in a vise grip.    Objective: Vital signs in last 24 hours: Temp:  [97.2 F (36.2 C)-99 F (37.2 C)] 98.4 F (36.9 C) (08/07 0553) Pulse Rate:  [70-88] 71  (08/07 0553) Resp:  [16-20] 17  (08/07 0553) BP: (131-155)/(84-89) 131/85 mmHg (08/07 0553) SpO2:  [100 %] 100 % (08/07 0553)  Intake/Output from previous day: 08/06 0701 - 08/07 0700 In: 1600 [I.V.:1600] Out: 75 [Blood:75] Intake/Output this shift: Total I/O In: 840 [P.O.:840] Out: 1 [Urine:1]  No results found for this basename: HGB:5 in the last 72 hours No results found for this basename: WBC:2,RBC:2,HCT:2,PLT:2 in the last 72 hours No results found for this basename: NA:2,K:2,CL:2,CO2:2,BUN:2,CREATININE:2,GLUCOSE:2,CALCIUM:2 in the last 72 hours No results found for this basename: LABPT:2,INR:2 in the last 72 hours  dressing c/d/i.  intact sensation index and thumb.  brisk capillary refill index, thumb, long.  poor capillary refill in ring.  Assessment/Plan: 1 Day Post-Op Procedure(s) (LRB): IRRIGATION AND DEBRIDEMENT EXTREMITY (Right) AMPUTATION DIGIT (Right) ARTERY REPAIR (Right) REPAIR MULTIPLE LACERATIONS (Right) Continue with pain control measures.  Hopefully home tomorrow if pain managed on oral medications.  Fabian Coca R 08/31/2011, 6:23 PM

## 2011-09-01 MED ORDER — ONDANSETRON HCL 4 MG/2ML IJ SOLN: 4.0000 mg | Freq: Four times a day (QID) | INTRAMUSCULAR | Status: DC | PRN

## 2011-09-01 MED ORDER — DIPHENHYDRAMINE HCL 50 MG/ML IJ SOLN: 12.5000 mg | Freq: Four times a day (QID) | INTRAMUSCULAR | Status: DC | PRN

## 2011-09-01 MED ORDER — DIPHENHYDRAMINE HCL 12.5 MG/5ML PO ELIX: 12.5000 mg | ORAL_SOLUTION | Freq: Four times a day (QID) | ORAL | Status: DC | PRN

## 2011-09-01 MED ORDER — CEFAZOLIN SODIUM 1-5 GM-% IV SOLN
1.0000 g | Freq: Three times a day (TID) | INTRAVENOUS | Status: DC
Start: 2011-09-01 — End: 2011-09-03
  Administered 2011-09-01 – 2011-09-03 (×6): 1 g via INTRAVENOUS
  Filled 2011-09-01 (×8): qty 50

## 2011-09-01 MED ORDER — SODIUM CHLORIDE 0.9 % IJ SOLN: 9.0000 mL | INTRAMUSCULAR | Status: DC | PRN

## 2011-09-01 MED ORDER — NALOXONE HCL 0.4 MG/ML IJ SOLN: 0.4000 mg | INTRAMUSCULAR | Status: DC | PRN

## 2011-09-01 MED ORDER — KETOROLAC TROMETHAMINE 30 MG/ML IJ SOLN
30.0000 mg | Freq: Four times a day (QID) | INTRAMUSCULAR | Status: DC | PRN
  Administered 2011-09-01 – 2011-09-03 (×7): 30 mg via INTRAVENOUS
  Filled 2011-09-01 (×6): qty 1

## 2011-09-01 MED ORDER — MORPHINE SULFATE (PF) 1 MG/ML IV SOLN
INTRAVENOUS | Status: DC
  Administered 2011-09-01: 30 mg via INTRAVENOUS
  Administered 2011-09-01: 10.5 mL via INTRAVENOUS
  Administered 2011-09-01: 7.1 mg via INTRAVENOUS
  Administered 2011-09-01: 05:00:00 via INTRAVENOUS
  Administered 2011-09-01: 23.5 mg via INTRAVENOUS
  Administered 2011-09-01: 20:00:00 via INTRAVENOUS
  Administered 2011-09-02: 15 mg via INTRAVENOUS
  Administered 2011-09-02: 10.75 mL via INTRAVENOUS
  Administered 2011-09-02: 01:00:00 via INTRAVENOUS
  Administered 2011-09-02: 20 mg via INTRAVENOUS
  Administered 2011-09-02: 18 mg via INTRAVENOUS
  Administered 2011-09-02: 07:00:00 via INTRAVENOUS
  Filled 2011-09-01 (×7): qty 25

## 2011-09-01 NOTE — Progress Notes (Signed)
Subjective: 2 Days Post-Op Procedure(s) (LRB): IRRIGATION AND DEBRIDEMENT EXTREMITY (Right) AMPUTATION DIGIT (Right) ARTERY REPAIR (Right) REPAIR MULTIPLE LACERATIONS (Right) Patient reports pain as moderate.  Continuing to have pain.  Started on PCA last night.  Feels the toradol was more helpful.  Wants to go home as soon as possible, but doesn't feel his pain is controlled enough yet.  Objective: Vital signs in last 24 hours: Temp:  [97.7 F (36.5 C)-98.2 F (36.8 C)] 97.7 F (36.5 C) (08/08 0652) Pulse Rate:  [68-69] 68  (08/08 0652) Resp:  [15-18] 16  (08/08 0800) BP: (141-156)/(84-96) 141/84 mmHg (08/08 0652) SpO2:  [97 %-100 %] 99 % (08/08 0800)  Intake/Output from previous day: 08/07 0701 - 08/08 0700 In: 840 [P.O.:840] Out: 1 [Urine:1] Intake/Output this shift: Total I/O In: 240 [P.O.:240] Out: -   No results found for this basename: HGB:5 in the last 72 hours No results found for this basename: WBC:2,RBC:2,HCT:2,PLT:2 in the last 72 hours No results found for this basename: NA:2,K:2,CL:2,CO2:2,BUN:2,CREATININE:2,GLUCOSE:2,CALCIUM:2 in the last 72 hours No results found for this basename: LABPT:2,INR:2 in the last 72 hours  intact sensation in thumb and index.  intact capillary refill in thumb, index, long fingers.  ring with purplish disclororation distally; intact sensation and color proximally.  no erythema or streaking.  Assessment/Plan: 2 Days Post-Op Procedure(s) (LRB): IRRIGATION AND DEBRIDEMENT EXTREMITY (Right) AMPUTATION DIGIT (Right) ARTERY REPAIR (Right) REPAIR MULTIPLE LACERATIONS (Right) Continue pain control.  Will add toradol again.  Ambulate in halls with arm elevated.  Maintain strict elevation of arm.  Graviel Payeur R 09/01/2011, 12:41 PM

## 2011-09-01 NOTE — Discharge Summary (Signed)
NAME:  HUZAIFA, VINEY NO.:  [id number]  MEDICAL RECORD NO.:  [id number]  LOCATION:  5N02C                        FACILITY:  [facility]  PHYSICIAN:  Betha Loa, MD        DATE OF BIRTH:  30-Aug-1975  DATE OF ADMISSION:  08/30/2011 DATE OF DISCHARGE:  08/31/2011                              DISCHARGE SUMMARY   FINAL DIAGNOSIS:  Right index, long, ring, and small finger crush injuries.  PROCEDURES:  Right index, long, ring, and small fingertip irrigation and debridement, revision amputation of small finger, repair of radial and ulnar digital arteries to ring finger.  HISTORY:  Mr. Corvino is a 36 year old right-hand dominant male who on the day of his admission was at work when a forklift fork fell onto his right hand crushing in his index, long, ring, and small fingers.  He presented to [facility] Emergency Department where radiographs were taken revealing multiple fractures of the fingers.  I was consulted for management of injury.  On examination, he had decreased sensation in his fingers.  He had intact capillary refill in the index finger, but bluish discoloration of the tips of the long, ring, and small fingers.  He had open wounds of all fingers.  He had fractures of the middle phalanx of the index, long, and ring fingers and at the distal phalanx of the small finger.  I recommended Mr. Barfoot going to the operating room for irrigation and debridement of the wounds, potential revision amputation of the ring finger, and repair of structures as indicated.  Risks, benefits, and alternatives of surgery were discussed including risk of blood loss, infection, damage to nerves, vessels, tendons, ligaments, bone, failure of procedure, need for additional procedures, complications with wound healing, continued pain, and failure of repair. He voiced understanding of these risks and elected to proceed.  HOSPITAL COURSE:  Mr. Meadowcroft was taken to the operating room in the  early morning hours of August 31, 2011, due to significant number of cases in the OR.  Irrigation and debridement of the open fractures was performed. Revision amputation of the small finger was performed.  The radial and ulnar artery to the ring finger were repaired under the microscope.  He tolerated procedure well.  He was kept overnight for IV antibiotics and pain control.  On evaluation the following day, he had good capillary refill in the index finger.  Good pink coloration under the nail of the long finger.  The ring finger had a bluish hue to it.  The thumb had good capillary refill and sensation.  He was discharged home with instructions to follow up in 1 week.  He was given prescription for Percocet 5/325 one to two p.o. q.6 h. p.r.n. pain, dispensed #50, aspirin 325 mg p.o. daily x30 days, and Bactrim DS 1 p.o. b.i.d. x7 days.   Addendum:  This discharge was cancelled due to pain control issues as the patients local block wore off prior to discharge.  Betha Loa, MD     KK/MEDQ  D:  08/31/2011  T:  09/01/2011  Job:  [id number]

## 2011-09-02 MED ORDER — OXYCODONE-ACETAMINOPHEN 5-325 MG PO TABS
1.0000 | ORAL_TABLET | Freq: Four times a day (QID) | ORAL | Status: DC | PRN
  Administered 2011-09-02 – 2011-09-03 (×4): 2 via ORAL
  Filled 2011-09-02 (×4): qty 2

## 2011-09-02 MED ORDER — OXYCODONE HCL 5 MG PO TABS
5.0000 mg | ORAL_TABLET | Freq: Four times a day (QID) | ORAL | Status: DC | PRN
  Administered 2011-09-02: 14:00:00 via ORAL
  Administered 2011-09-02 – 2011-09-03 (×3): 5 mg via ORAL
  Filled 2011-09-02: qty 2
  Filled 2011-09-02 (×3): qty 1

## 2011-09-02 NOTE — Progress Notes (Signed)
Subjective: 3 Days Post-Op Procedure(s) (LRB): IRRIGATION AND DEBRIDEMENT EXTREMITY (Right) AMPUTATION DIGIT (Right) ARTERY REPAIR (Right) REPAIR MULTIPLE LACERATIONS (Right) Patient reports pain as pain much better controlled today.  slept some last night.  patient feels toradol is very helpful.  currently using pca.  no oral pain medications..    Objective: Vital signs in last 24 hours: Temp:  [97.8 F (36.6 C)-98.2 F (36.8 C)] 97.8 F (36.6 C) (08/09 0515) Pulse Rate:  [57-61] 57  (08/09 0515) Resp:  [13-18] 16  (08/09 0800) BP: (114-159)/(66-89) 114/66 mmHg (08/09 0515) SpO2:  [95 %-98 %] 95 % (08/09 0800)  Intake/Output from previous day: 08/08 0701 - 08/09 0700 In: 1920 [P.O.:720; I.V.:1200] Out: -  Intake/Output this shift: Total I/O In: 240 [P.O.:240] Out: -   No results found for this basename: HGB:5 in the last 72 hours No results found for this basename: WBC:2,RBC:2,HCT:2,PLT:2 in the last 72 hours No results found for this basename: NA:2,K:2,CL:2,CO2:2,BUN:2,CREATININE:2,GLUCOSE:2,CALCIUM:2 in the last 72 hours No results found for this basename: LABPT:2,INR:2 in the last 72 hours  intact sensation and capillary refill thumb and index.  long with poor sensation but intact capillary refill.  ring with no sensation.  nail bed looks pink proximally.  overall improved appearance to ring finger.  Assessment/Plan: 3 Days Post-Op Procedure(s) (LRB): IRRIGATION AND DEBRIDEMENT EXTREMITY (Right) AMPUTATION DIGIT (Right) ARTERY REPAIR (Right) REPAIR MULTIPLE LACERATIONS (Right) Will d/c pca and change back to oral pain meds with iv for breakthrough.  Continue toradol.  Discussed possibility of discharge on oral pain meds and toradol if he does well.  Will plan follow up on Monday if d/c this weekend.  [name] [name] R 09/02/2011, 12:20 PM

## 2011-09-03 MED ORDER — KETOROLAC TROMETHAMINE 10 MG PO TABS: 10.0000 mg | ORAL_TABLET | Freq: Four times a day (QID) | ORAL | Status: AC | PRN

## 2011-09-03 MED ORDER — HYDROMORPHONE HCL 2 MG PO TABS: 2.0000 mg | ORAL_TABLET | Freq: Four times a day (QID) | ORAL | Status: AC | PRN

## 2011-09-03 MED ORDER — CYCLOBENZAPRINE HCL 10 MG PO TABS: 10.0000 mg | ORAL_TABLET | Freq: Three times a day (TID) | ORAL | Status: AC | PRN

## 2011-09-03 MED ORDER — OXYCODONE-ACETAMINOPHEN 10-325 MG PO TABS: ORAL_TABLET | ORAL | Status: DC

## 2011-09-03 MED ORDER — ASPIRIN EC 325 MG PO TBEC: 325.0000 mg | DELAYED_RELEASE_TABLET | Freq: Every day | ORAL | Status: AC

## 2011-09-03 MED ORDER — SULFAMETHOXAZOLE-TRIMETHOPRIM 800-160 MG PO TABS: 1.0000 | ORAL_TABLET | Freq: Two times a day (BID) | ORAL | Status: AC

## 2011-09-03 NOTE — Discharge Summary (Signed)
  [id number] 

## 2011-09-04 NOTE — Discharge Summary (Signed)
NAME:  [name] [name], [name] [name] NO.:  [id number]  MEDICAL RECORD NO.:  [id number]  LOCATION:  5N02C                        FACILITY:  [facility]  PHYSICIAN:  Betha Loa, MD        DATE OF BIRTH:  [birth year]  DATE OF ADMISSION:  08/30/2011 DATE OF DISCHARGE:  09/03/2011                              DISCHARGE SUMMARY   FINAL DIAGNOSIS:  Crush injury, right hand.  PROCEDURES:  Right index, long, ring, and small fingertip irrigation and debridements of open fractures with revision amputation of the small finger and revascularization of radial and ulnar digital arteries of ring finger.  HISTORY:  [name] [name] is a 36 year old right-hand-dominant male, who was at work on the day of his injury when a forklift fork fell onto his hand and crushed his hand.  He presented to [facility] Emergency Department where he was evaluated and he was found to have multiple fractures of his digits.  I was consulted for management of injury.  On evaluation, he had decreased blood flow in the long and ring finger tips and near- amputation of the small finger.  I recommended going to the operating room for irrigation and debridement, and repair of the associated structures with amputation of small finger.  He agreed with plan of care.  HOSPITAL COURSE:  [name] [name] was taken to the operating room in the early morning hours of August 7.  The above-noted procedures were performed. He did well postoperatively.  He was kept in the hospital for a few days for pain control.  On August 10, he was noted to be in good control of his pain on oral medications.  He was felt safe to discharge home that day.  He was discharged home with follow up in 2-3 days.  He was written Percocet 10/325, 1-2 p.o. q.6 hours p.r.n. pain, dispensed #40. Dilaudid 2 mg p.o. q.6 hours p.r.n. breakthrough pain, dispensed #5. Flexeril 10 mg p.o. q.8 hours p.r.n. muscle spasms, dispensed #15. Bactrim DS 1 p.o. b.i.d. for 7 days.  Toradol 10  mg p.o. q.6 hours, dispensed #6.     Betha Loa, MD     KK/MEDQ  D:  09/03/2011  T:  09/04/2011  Job:  [id number]

## 2012-06-24 DIAGNOSIS — S62609K Fracture of unspecified phalanx of unspecified finger, subsequent encounter for fracture with nonunion: Secondary | ICD-10-CM

## 2012-06-24 HISTORY — DX: Fracture of unspecified phalanx of unspecified finger, subsequent encounter for fracture with nonunion: S62.609K

## 2012-06-28 ENCOUNTER — Other Ambulatory Visit: Payer: Self-pay | Admitting: Orthopedic Surgery

## 2012-06-29 ENCOUNTER — Encounter (HOSPITAL_BASED_OUTPATIENT_CLINIC_OR_DEPARTMENT_OTHER): Payer: Self-pay | Admitting: *Deleted

## 2012-06-29 DIAGNOSIS — S80819A Abrasion, unspecified lower leg, initial encounter: Secondary | ICD-10-CM

## 2012-06-29 HISTORY — DX: Abrasion, unspecified lower leg, initial encounter: S80.819A

## 2012-07-03 ENCOUNTER — Ambulatory Visit (HOSPITAL_BASED_OUTPATIENT_CLINIC_OR_DEPARTMENT_OTHER): Payer: Worker's Compensation | Admitting: Anesthesiology

## 2012-07-03 ENCOUNTER — Encounter (HOSPITAL_BASED_OUTPATIENT_CLINIC_OR_DEPARTMENT_OTHER)
Admission: RE | Disposition: A | Discharge status: Home / Self Care | Payer: Self-pay | Source: Ambulatory Visit | Attending: Orthopedic Surgery

## 2012-07-03 ENCOUNTER — Encounter (HOSPITAL_BASED_OUTPATIENT_CLINIC_OR_DEPARTMENT_OTHER): Payer: Self-pay

## 2012-07-03 ENCOUNTER — Encounter (HOSPITAL_BASED_OUTPATIENT_CLINIC_OR_DEPARTMENT_OTHER): Payer: Self-pay | Admitting: Anesthesiology

## 2012-07-03 ENCOUNTER — Ambulatory Visit (HOSPITAL_BASED_OUTPATIENT_CLINIC_OR_DEPARTMENT_OTHER)
Admission: RE | Admit: 2012-07-03 | Discharge: 2012-07-03 | Disposition: A | Discharge status: Home / Self Care | Payer: Worker's Compensation | Source: Ambulatory Visit | Attending: Orthopedic Surgery | Admitting: Orthopedic Surgery

## 2012-07-03 DIAGNOSIS — IMO0002 Reserved for concepts with insufficient information to code with codable children: Secondary | ICD-10-CM | POA: Insufficient documentation

## 2012-07-03 DIAGNOSIS — S42309S Unspecified fracture of shaft of humerus, unspecified arm, sequela: Secondary | ICD-10-CM | POA: Insufficient documentation

## 2012-07-03 DIAGNOSIS — K219 Gastro-esophageal reflux disease without esophagitis: Secondary | ICD-10-CM | POA: Insufficient documentation

## 2012-07-03 DIAGNOSIS — G5600 Carpal tunnel syndrome, unspecified upper limb: Secondary | ICD-10-CM | POA: Insufficient documentation

## 2012-07-03 DIAGNOSIS — F411 Generalized anxiety disorder: Secondary | ICD-10-CM | POA: Insufficient documentation

## 2012-07-03 DIAGNOSIS — M19049 Primary osteoarthritis, unspecified hand: Secondary | ICD-10-CM | POA: Insufficient documentation

## 2012-07-03 HISTORY — PX: OPEN REDUCTION INTERNAL FIXATION (ORIF) FINGER WITH RADIAL BONE GRAFT: SHX5666

## 2012-07-03 HISTORY — DX: Fracture of unspecified phalanx of unspecified finger, subsequent encounter for fracture with nonunion: S62.609K

## 2012-07-03 HISTORY — DX: Other disorder of circulatory system: I99.8

## 2012-07-03 HISTORY — DX: Unspecified osteoarthritis, unspecified site: M19.90

## 2012-07-03 HISTORY — DX: Anxiety disorder, unspecified: F41.9

## 2012-07-03 HISTORY — DX: Dental restoration status: Z98.811

## 2012-07-03 HISTORY — PX: CARPAL TUNNEL RELEASE: SHX101

## 2012-07-03 HISTORY — DX: Abrasion, unspecified lower leg, initial encounter: S80.819A

## 2012-07-03 HISTORY — DX: Presence of dental prosthetic device (complete) (partial): Z97.2

## 2012-07-03 HISTORY — DX: Gastro-esophageal reflux disease without esophagitis: K21.9

## 2012-07-03 LAB — POCT HEMOGLOBIN-HEMACUE: Hemoglobin: 15.5 g/dL (ref 13.0–17.0)

## 2012-07-03 SURGERY — OPEN REDUCTION INTERNAL FIXATION (ORIF) FINGER WITH RADIAL BONE GRAFT
Anesthesia: General | Site: Wrist | Laterality: Right | Wound class: Clean

## 2012-07-03 MED ORDER — FENTANYL CITRATE 0.05 MG/ML IJ SOLN
50.0000 ug | INTRAMUSCULAR | Status: DC | PRN
  Administered 2012-07-03: 100 ug via INTRAVENOUS

## 2012-07-03 MED ORDER — 0.9 % SODIUM CHLORIDE (POUR BTL) OPTIME
TOPICAL | Status: DC | PRN
  Administered 2012-07-03: 300 mL

## 2012-07-03 MED ORDER — PROPOFOL 10 MG/ML IV BOLUS
INTRAVENOUS | Status: DC | PRN
  Administered 2012-07-03: 300 mg via INTRAVENOUS

## 2012-07-03 MED ORDER — CEFAZOLIN SODIUM-DEXTROSE 2-3 GM-% IV SOLR
2.0000 g | INTRAVENOUS | Status: AC
  Administered 2012-07-03: 2 g via INTRAVENOUS

## 2012-07-03 MED ORDER — MIDAZOLAM HCL 2 MG/2ML IJ SOLN
1.0000 mg | INTRAMUSCULAR | Status: DC | PRN
  Administered 2012-07-03: 2 mg via INTRAVENOUS

## 2012-07-03 MED ORDER — DEXAMETHASONE SODIUM PHOSPHATE 10 MG/ML IJ SOLN
INTRAMUSCULAR | Status: DC | PRN
  Administered 2012-07-03: 10 mg via INTRAVENOUS

## 2012-07-03 MED ORDER — DEXAMETHASONE SODIUM PHOSPHATE 4 MG/ML IJ SOLN
INTRAMUSCULAR | Status: DC | PRN
  Administered 2012-07-03: 4 mg

## 2012-07-03 MED ORDER — CHLORHEXIDINE GLUCONATE 4 % EX LIQD: 60.0000 mL | Freq: Once | CUTANEOUS | Status: DC

## 2012-07-03 MED ORDER — MIDAZOLAM HCL 2 MG/ML PO SYRP: 12.0000 mg | ORAL_SOLUTION | Freq: Once | ORAL | Status: DC | PRN

## 2012-07-03 MED ORDER — HYDROMORPHONE HCL PF 1 MG/ML IJ SOLN
0.2500 mg | INTRAMUSCULAR | Status: DC | PRN
  Administered 2012-07-03: 0.5 mg via INTRAVENOUS

## 2012-07-03 MED ORDER — OXYCODONE HCL 5 MG/5ML PO SOLN: 5.0000 mg | Freq: Once | ORAL | Status: AC | PRN

## 2012-07-03 MED ORDER — LACTATED RINGERS IV SOLN
INTRAVENOUS | Status: DC
  Administered 2012-07-03 (×2): via INTRAVENOUS

## 2012-07-03 MED ORDER — PROMETHAZINE HCL 25 MG/ML IJ SOLN: 6.2500 mg | INTRAMUSCULAR | Status: DC | PRN

## 2012-07-03 MED ORDER — LIDOCAINE HCL (CARDIAC) 20 MG/ML IV SOLN
INTRAVENOUS | Status: DC | PRN
  Administered 2012-07-03: 7 mg via INTRAVENOUS

## 2012-07-03 MED ORDER — OXYCODONE HCL 5 MG PO TABS
5.0000 mg | ORAL_TABLET | Freq: Once | ORAL | Status: AC | PRN
  Administered 2012-07-03: 5 mg via ORAL

## 2012-07-03 MED ORDER — BUPIVACAINE-EPINEPHRINE PF 0.5-1:200000 % IJ SOLN
INTRAMUSCULAR | Status: DC | PRN
  Administered 2012-07-03: 25 mL

## 2012-07-03 SURGICAL SUPPLY — 62 items
BANDAGE ELASTIC 3 VELCRO ST LF (GAUZE/BANDAGES/DRESSINGS) ×3 IMPLANT
BANDAGE GAUZE ELAST BULKY 4 IN (GAUZE/BANDAGES/DRESSINGS) ×6 IMPLANT
BLADE MINI RND TIP GREEN BEAV (BLADE) ×3 IMPLANT
BLADE SURG 10 STRL SS (BLADE) ×3 IMPLANT
BLADE SURG 15 STRL LF DISP TIS (BLADE) ×4 IMPLANT
BLADE SURG 15 STRL SS (BLADE) ×2
BNDG ELASTIC 2 VLCR STRL LF (GAUZE/BANDAGES/DRESSINGS) IMPLANT
BNDG ESMARK 4X9 LF (GAUZE/BANDAGES/DRESSINGS) ×3 IMPLANT
CHLORAPREP W/TINT 26ML (MISCELLANEOUS) ×3 IMPLANT
CLOTH BEACON ORANGE TIMEOUT ST (SAFETY) ×3 IMPLANT
CORDS BIPOLAR (ELECTRODE) ×3 IMPLANT
COVER MAYO STAND STRL (DRAPES) ×3 IMPLANT
COVER TABLE BACK 60X90 (DRAPES) ×3 IMPLANT
CUFF TOURNIQUET SINGLE 18IN (TOURNIQUET CUFF) ×3 IMPLANT
DRAPE EXTREMITY T 121X128X90 (DRAPE) ×3 IMPLANT
DRAPE OEC MINIVIEW 54X84 (DRAPES) ×3 IMPLANT
DRAPE SURG 17X23 STRL (DRAPES) ×3 IMPLANT
DRSG PAD ABDOMINAL 8X10 ST (GAUZE/BANDAGES/DRESSINGS) ×3 IMPLANT
GAUZE XEROFORM 1X8 LF (GAUZE/BANDAGES/DRESSINGS) ×3 IMPLANT
GLOVE BIO SURGEON STRL SZ 6 (GLOVE) ×3 IMPLANT
GLOVE BIO SURGEON STRL SZ 6.5 (GLOVE) ×3 IMPLANT
GLOVE BIO SURGEON STRL SZ7.5 (GLOVE) ×3 IMPLANT
GLOVE BIOGEL PI IND STRL 6.5 (GLOVE) ×2 IMPLANT
GLOVE BIOGEL PI IND STRL 7.0 (GLOVE) ×2 IMPLANT
GLOVE BIOGEL PI IND STRL 8 (GLOVE) ×2 IMPLANT
GLOVE BIOGEL PI IND STRL 8.5 (GLOVE) ×2 IMPLANT
GLOVE BIOGEL PI INDICATOR 6.5 (GLOVE) ×1
GLOVE BIOGEL PI INDICATOR 7.0 (GLOVE) ×1
GLOVE BIOGEL PI INDICATOR 8 (GLOVE) ×1
GLOVE BIOGEL PI INDICATOR 8.5 (GLOVE) ×1
GLOVE EXAM NITRILE MD LF STRL (GLOVE) ×3 IMPLANT
GLOVE SURG ORTHO 8.0 STRL STRW (GLOVE) ×3 IMPLANT
GOWN BRE IMP PREV XXLGXLNG (GOWN DISPOSABLE) ×6 IMPLANT
GOWN PREVENTION PLUS XLARGE (GOWN DISPOSABLE) ×6 IMPLANT
GOWN PREVENTION PLUS XXLARGE (GOWN DISPOSABLE) ×3 IMPLANT
K-WIRE DBL TRONS .035X6 (WIRE) ×9
KWIRE DBL TRONS .035X6 (WIRE) ×6 IMPLANT
NEEDLE HYPO 22GX1.5 SAFETY (NEEDLE) IMPLANT
NEEDLE HYPO 25X1 1.5 SAFETY (NEEDLE) IMPLANT
NS IRRIG 1000ML POUR BTL (IV SOLUTION) ×3 IMPLANT
PACK BASIN DAY SURGERY FS (CUSTOM PROCEDURE TRAY) ×3 IMPLANT
PAD CAST 3X4 CTTN HI CHSV (CAST SUPPLIES) IMPLANT
PAD CAST 4YDX4 CTTN HI CHSV (CAST SUPPLIES) ×2 IMPLANT
PADDING CAST ABS 4INX4YD NS (CAST SUPPLIES) ×1
PADDING CAST ABS COTTON 4X4 ST (CAST SUPPLIES) ×2 IMPLANT
PADDING CAST COTTON 3X4 STRL (CAST SUPPLIES)
PADDING CAST COTTON 4X4 STRL (CAST SUPPLIES) ×1
SLEEVE SCD COMPRESS KNEE MED (MISCELLANEOUS) ×3 IMPLANT
SPLINT PLASTER CAST XFAST 4X15 (CAST SUPPLIES) ×20 IMPLANT
SPLINT PLASTER XTRA FAST SET 4 (CAST SUPPLIES) ×10
SPONGE GAUZE 4X4 12PLY (GAUZE/BANDAGES/DRESSINGS) ×3 IMPLANT
STOCKINETTE 4X48 STRL (DRAPES) ×3 IMPLANT
SUT ETHILON 3 0 PS 1 (SUTURE) IMPLANT
SUT ETHILON 4 0 PS 2 18 (SUTURE) ×6 IMPLANT
SUT MERSILENE 4 0 P 3 (SUTURE) ×3 IMPLANT
SUT VIC AB 3-0 PS1 18 (SUTURE) ×1
SUT VIC AB 3-0 PS1 18XBRD (SUTURE) ×2 IMPLANT
SUT VICRYL 4-0 PS2 18IN ABS (SUTURE) IMPLANT
SYR BULB 3OZ (MISCELLANEOUS) ×3 IMPLANT
SYR CONTROL 10ML LL (SYRINGE) IMPLANT
TOWEL OR 17X24 6PK STRL BLUE (TOWEL DISPOSABLE) ×6 IMPLANT
UNDERPAD 30X30 INCONTINENT (UNDERPADS AND DIAPERS) ×3 IMPLANT

## 2012-07-03 NOTE — Anesthesia Procedure Notes (Signed)
Anesthesia Regional Block:  Supraclavicular block  Pre-Anesthetic Checklist: ,, timeout performed, Correct Patient, Correct Site, Correct Laterality, Correct Procedure, Correct Position, site marked, Risks and benefits discussed,  Surgical consent,  Pre-op evaluation,  At surgeon's request and post-op pain management  Laterality: Right  Prep: chloraprep       Needles:  Injection technique: Single-shot  Needle Type: Echogenic Stimulator Needle     Needle Length: 5cm 5 cm Needle Gauge: 22 and 22 G    Additional Needles:  Procedures: ultrasound guided (picture in chart) and nerve stimulator Supraclavicular block  Nerve Stimulator or Paresthesia:  Response: 0.48 mA,   Additional Responses:   Narrative:  Start time: 07/03/2012 11:00 AM End time: 07/03/2012 11:07 AM Injection made incrementally with aspirations every 3 mL. Anesthesiologist: Dr Gypsy Balsam  Additional Notes: 1100-1107 R Supraclav N Block POP CHG prep, sterile tech #22 stim needle w/stim down to .48ma and good [place] visualization-PIX in chart Marc .5% w/epi 1:200000 25cc+decadron 4mg  infil Multiple neg asp No compl Dr Gypsy Balsam

## 2012-07-03 NOTE — Op Note (Signed)
[id number] 

## 2012-07-03 NOTE — Transfer of Care (Signed)
Immediate Anesthesia Transfer of Care Note  Patient: Champ A Sigmund  Procedure(s) Performed: Procedure(s): OPEN REDUCTION INTERNAL FIXATION (ORIF) LONG FINGER WITH BONE GRAFT (Right) CARPAL TUNNEL RELEASE (Right)  Patient Location: PACU  Anesthesia Type:GA combined with regional for post-op pain  Level of Consciousness: awake, sedated and patient cooperative  Airway & Oxygen Therapy: Patient Spontanous Breathing and Patient connected to face mask oxygen  Post-op Assessment: Report given to PACU RN and Post -op Vital signs reviewed and stable  Post vital signs: Reviewed and stable  Complications: No apparent anesthesia complications

## 2012-07-03 NOTE — Brief Op Note (Signed)
07/03/2012  2:49 PM  PATIENT:  [name] [name]  37 y.o. male  PRE-OPERATIVE DIAGNOSIS:  Right Long Finger Nonunion; Carpal Tunnel Syndrome  POST-OPERATIVE DIAGNOSIS:  Right Long Finger Nonunion; Carpal Tunnel Syndrome  PROCEDURE:  Procedure(s): OPEN REDUCTION INTERNAL FIXATION (ORIF) LONG FINGER WITH BONE GRAFT (Right) CARPAL TUNNEL RELEASE (Right)  SURGEON:  Surgeon(s) and Role:    * Tami Ribas, MD - Primary    * [name], MD - Assisting  PHYSICIAN ASSISTANT:   ASSISTANTS: Cindee Salt, MD   ANESTHESIA:   regional and general  EBL:  Total I/O In: 1000 [I.V.:1000] Out: -   BLOOD ADMINISTERED:none  DRAINS: none   LOCAL MEDICATIONS USED:  NONE  SPECIMEN:  No Specimen  DISPOSITION OF SPECIMEN:  N/A  COUNTS:  YES  TOURNIQUET:   Total Tourniquet Time Documented: Upper Arm (Right) - 98 minutes Total: Upper Arm (Right) - 98 minutes   DICTATION: .Other Dictation: Dictation Number [id number]  PLAN OF CARE: Discharge to home after PACU  PATIENT DISPOSITION:  PACU - hemodynamically stable.

## 2012-07-03 NOTE — H&P (Signed)
[name] [name] is an 37 y.o. male.   Chief Complaint: right long finger non uninon HPI: 37 yo rhd male suffered crush injury to right fingers from forklift in August 2013.  Underwent surgical debridement.  Has had continued pain in long finger middle phalanx with deformity.  He wishes to undergo surgical fixation vs fusion.  Also with carpal tunnel syndrome with positive nerve conduction studies.  Past Medical History  Diagnosis Date  . GERD (gastroesophageal reflux disease)   . Arthritis     right hand  . Anxiety   . Fluctuating blood pressure     no current med.  . Dental crowns present   . Dental bridge present     lower  . Abrasion of leg 06/29/2012    abrasions bilateral leg  . Fracture of finger of right hand with nonunion 06/2012    long finger    Past Surgical History  Procedure Laterality Date  . Orif ankle fracture Right   . I&d extremity  08/30/2011    Procedure: IRRIGATION AND DEBRIDEMENT EXTREMITY;  Surgeon: Tami Ribas, MD;  Location: [facility] OR;  Service: Orthopedics;  Laterality: Right;  . Amputation  08/30/2011    Procedure: AMPUTATION DIGIT;  Surgeon: Tami Ribas, MD;  Location: [facility] OR;  Service: Orthopedics;  Laterality: Right;  partial  . Artery repair  08/30/2011    Procedure: ARTERY REPAIR;  Surgeon: Tami Ribas, MD;  Location: [facility] OR;  Service: Orthopedics;  Laterality: Right;  . Laceration repair  08/30/2011    Procedure: REPAIR MULTIPLE LACERATIONS;  Surgeon: Tami Ribas, MD;  Location[facility] OR;  Service: Orthopedics;  Laterality: Right;  multiple fingers crush injury    History reviewed. No pertinent family history. Social History:  reports that he has been smoking Cigarettes.  He has a 10 pack-year smoking history. He has quit using smokeless tobacco. He reports that  drinks alcohol. He reports that he does not use illicit drugs.  Allergies: No Known Allergies  Medications Prior to Admission  Medication Sig Dispense Refill  . HYDROcodone-acetaminophen (NORCO)  10-325 MG per tablet Take 1 tablet by mouth every 6 (six) hours as needed for pain.      Marland Kitchen LORazepam (ATIVAN) 1 MG tablet Take 1 mg by mouth every 8 (eight) hours.      Marland Kitchen omeprazole (PRILOSEC) 10 MG capsule Take 10 mg by mouth daily.        Results for orders placed during the hospital encounter of 07/03/12 (from the past 48 hour(s))  POCT HEMOGLOBIN-HEMACUE     Status: None   Collection Time    07/03/12 10:56 AM      Result Value Range   Hemoglobin 15.5  13.0 - 17.0 g/dL    No results found.   A comprehensive review of systems was negative except for: Constitutional: positive for fevers  Blood pressure 124/59, pulse 74, temperature 97.4 F (36.3 C), temperature source Oral, resp. rate 9, height 5\' 10"  (1.778 m), weight 83.008 kg (183 lb), SpO2 100.00%.  General appearance: alert, cooperative and appears stated age Head: Normocephalic, without obvious abnormality, atraumatic Neck: supple, symmetrical, trachea midline Resp: clear to auscultation bilaterally Cardio: regular rate and rhythm GI: non tender Extremities: intact sensation and capillary refill all digits, though ring finger sensation is different but present.  +epl/fpl/io.  wounds healed.  ttp long finger. Pulses: 2+ and symmetric Skin: Skin color, texture, turgor normal. No rashes or lesions Neurologic: Grossly normal Incision/Wound: na  Assessment/Plan Right  long finger middle phalanx non union and carpal tunnel syndrome.  Discussed non operative and operative treatment options and he wishes to undergo operative treatment.  Plan for ORIF right long finger middle phalanx with distal radial vs ulnar bone grafting or PIP joint fusion if not enough bone for adequate fixation.  Also plan carpal tunnel release.  Risks, benefits, and alternatives of surgery were discussed and the patient agrees with the plan of care.   Helem Reesor R 07/03/2012, 11:26 AM

## 2012-07-03 NOTE — Progress Notes (Signed)
Assisted Dr. Kasik with right, ultrasound guided, supraclavicular block. Side rails up, monitors on throughout procedure. See vital signs in flow sheet. Tolerated Procedure well. 

## 2012-07-03 NOTE — Anesthesia Preprocedure Evaluation (Signed)
Anesthesia Evaluation  Patient identified by MRN, date of birth, ID band Patient awake    Reviewed: Allergy & Precautions, H&P , NPO status , Patient's Chart, lab work & pertinent test results  Airway Mallampati: I TM Distance: >3 FB     Dental   Pulmonary Current Smoker,  + rhonchi         Cardiovascular Rhythm:Regular Rate:Normal     Neuro/Psych    GI/Hepatic GERD-  ,  Endo/Other    Renal/GU      Musculoskeletal   Abdominal   Peds  Hematology   Anesthesia Other Findings   Reproductive/Obstetrics                           Anesthesia Physical Anesthesia Plan  ASA: II  Anesthesia Plan: General   Post-op Pain Management:    Induction: Intravenous  Airway Management Planned: LMA  Additional Equipment:   Intra-op Plan:   Post-operative Plan: Extubation in OR  Informed Consent: I have reviewed the patients History and Physical, chart, labs and discussed the procedure including the risks, benefits and alternatives for the proposed anesthesia with the patient or authorized representative who has indicated his/her understanding and acceptance.     Plan Discussed with: CRNA and Surgeon  Anesthesia Plan Comments:         Anesthesia Quick Evaluation

## 2012-07-03 NOTE — Anesthesia Postprocedure Evaluation (Signed)
  Anesthesia Post-op Note  Patient: [name] [name]  Procedure(s) Performed: Procedure(s): OPEN REDUCTION INTERNAL FIXATION (ORIF) LONG FINGER WITH BONE GRAFT (Right) CARPAL TUNNEL RELEASE (Right)  Patient Location: PACU  Anesthesia Type:GA combined with regional for post-op pain  Level of Consciousness: awake  Airway and Oxygen Therapy: Patient Spontanous Breathing  Post-op Pain: none  Post-op Assessment: Post-op Vital signs reviewed, Patient's Cardiovascular Status Stable, Respiratory Function Stable, Patent Airway, No signs of Nausea or vomiting and Pain level controlled  Post-op Vital Signs: stable  Complications: No apparent anesthesia complications

## 2012-07-04 ENCOUNTER — Encounter (HOSPITAL_BASED_OUTPATIENT_CLINIC_OR_DEPARTMENT_OTHER): Payer: Self-pay | Admitting: Orthopedic Surgery

## 2012-07-04 NOTE — Op Note (Signed)
NAME:  Farace, JOY                          ACCOUNT NO.:  MEDICAL RECORD NO.:  [id number]  LOCATION:                                 FACILITY:  PHYSICIAN:  Betha Loa, MD             DATE OF BIRTH:  DATE OF PROCEDURE:  07/03/2012 DATE OF DISCHARGE:                              OPERATIVE REPORT   PREOPERATIVE DIAGNOSIS:  Right carpal tunnel syndrome and right long finger middle phalanx nonunion.  POSTOPERATIVE DIAGNOSIS:  Right carpal tunnel syndrome and right long finger middle phalanx nonunion.  PROCEDURE:   1. Right carpal tunnel release 2. Right long finger middle phalanx open reduction and internal fixation with distal  radius bone graft.  SURGEON:  Betha Loa, MD  ASSISTANT:  Cindee Salt, MD.  ANESTHESIA:  General with regional.  IV FLUIDS:  Per anesthesia flow sheet.  ESTIMATED BLOOD LOSS:  Minimal.  COMPLICATIONS:  None.  SPECIMENS:  None.  TOURNIQUET TIME:  98 minutes.  DISPOSITION:  Stable to PACU.  INDICATIONS:  Mr. Vacha is a 37 year old right-hand-dominant male, who 10 months ago had a forklift fall on his right hand injuring in his index long ring and small fingers.  He underwent irrigation debridement of the wounds and vascular repair in the ring finger and amputation of the small finger.  He did well postoperatively, but has had continued pain in the long finger over the middle phalanx.  There has been noted to be displacement of the fracture.  We discussed treatment options.  He elected to proceed with open reduction and internal fixation with bone grafting versus fusion of the PIP joint.  He also has carpal tunnel syndrome without positive nerve conduction studies and would like this released as well.  Risks, benefits, and alternatives of surgery were discussed including risk of blood loss, infection, damage to nerves, vessels, tendons, ligaments, bone; failure of surgery; need for additional surgery, complications with wound healing, continued  pain, nonunion, malunion, stiffness, and continued carpal tunnel syndrome.  He voiced understanding these risks and elected to proceed.  OPERATIVE COURSE:  After being identified preoperatively by myself, the patient and I agreed upon procedure and site of procedure.  Surgical site was marked.  The risks, benefits, and alternatives of surgery were reviewed and wished to proceed.  Surgical consent had been signed.  He was given 2 g of IV Ancef as preoperative antibiotic prophylaxis. Regional block was performed by anesthesia in preoperative holding.  He was transferred to the operating room and placed on the operating table in supine position with the right upper extremity on arm board.  General anesthesia was induced by anesthesiologist.  Right upper extremity was prepped and draped in normal sterile orthopedic fashion.  Surgical pause was performed between surgeons, anesthesia, operating staff, and all were in agreement as to the patient, procedure, and site of procedure. Tourniquet at the proximal aspect of the extremities was inflated to 250 mmHg.  After exsanguination of the limb with Esmarch bandage.  The carpal tunnel was addressed first.  An incision was made over the transverse carpal ligament and carried into  subcutaneous tissues by spreading technique.  The palmar fascia was sharply incised.  The transverse carpal ligament was identified and incised sharply.  It was incised in its distal direction.  Care was taken to ensure complete decompression.  It was incised proximally.  The scissors were used to split the distal aspect of the volar antebrachial fascia.  The nerve was explored.  Motor branch was identified and was intact.  A finger was placed into the wound to ensure complete decompression, which was the case.  Wound was copiously irrigated with sterile saline.  It was closed with 4-0 nylon in a horizontal mattress fashion.  Attention was turned to the long finger.   Incision was made on the dorsum of the finger over the middle phalanx.  It was carried into subcutaneous tissues by spreading technique.  There was significant scar formation.  The extensor tendon and periosteum were incised longitudinally.  The periosteum was elevated.  The fracture site was identified.  There was fibrous union.  The fibrous union was taken down.  It was adherent volarly.  This was freed up.  Reduction was able to be obtained.  There was a bone spur on the ulnar side.  It was felt that removing this would destabilize the joint and potentially harm the neurovascular bundle. The C-arm was used in AP and lateral projections to ensure appropriate reduction, which was the case.  An incision was made at the dorsum of the wrist over Lister's tubercle.  It was carried into subcutaneous tissues by spreading technique.  Bipolar electrocautery was used for hemostasis.  The extensor tendons were protected.  Periosteum was incised.  A hole was made in the dorsal cortex using the K-wire driver and an osteotome.  Cancellous bone graft was harvested.  The nonunion site was copiously irrigated with sterile saline.  The 0.035-inch K- wires were then advanced in a retrograde fashion into the proximal fragment now through the skin.  The fracture site was then packed with the cancellous graft and the phalanx reduced.  The K-wires were then advanced across the fracture site into the distal aspect of the middle phalanx.  This was done in a crossed fashion.  This provided good stability.  C-arm was used in AP and lateral projections to ensure appropriate reduction and position of hardware, which was the case.  The fracture site was much less visible with the bone graft present.  The pins were bent and cut short.  The periosteum and extensor tendon were repaired with 4-0 Mersilene in a running fashion.  The skin was closed with 4-0 nylon in a horizontal mattress fashion.  The periosteum at  the wrist was repaired with 4-0 Vicryl suture in a figure-of-eight fashion. A single interrupted Vicryl sutures were placed in subcutaneous tissues and skin was closed with 4-0 nylon in a horizontal mattress fashion. The wounds were all dressed with sterile Xeroform, 4x4s, and wrapped with a Kerlix bandage.  A volar and dorsal slab splint including the Index, long, and ring fingers was placed and wrapped with Kerlix and Ace bandage.  Tourniquet was deflated at 98 minutes.  Fingertips were pink with brisk capillary refill after deflation of tourniquet.  Operative drapes were broken down.  The patient was awoken from anesthesia safely and was transferred back to stretcher and taken to PACU in stable condition.  I will see him back in the office in 1 week for postoperative followup.  He was given up for a prescription for Percocet 5/325 one to two  p.o. q.6 hours p.r.n. pain, dispensed #40 yesterday.     Betha Loa, MD     KK/MEDQ  D:  07/03/2012  T:  07/04/2012  Job:  [id number]

## 2012-08-15 ENCOUNTER — Telehealth (HOSPITAL_COMMUNITY): Payer: Self-pay

## 2012-08-15 NOTE — Telephone Encounter (Signed)
08/15/12 10:02am  Called and left msg at 9:59am with Sarah (Dr. Betha Loa) the office referred a new patient but due to the provider's schedule unable to accept the patient at this time/sh

## 2012-12-05 IMAGING — CR DG HAND COMPLETE 3+V*R*
3 series · 3 of 3 positions shown · non-contrast
Comparison: None.

CLINICAL DATA: Crush injury

RIGHT HAND - COMPLETE 3+ VIEW

[x hand pa right]
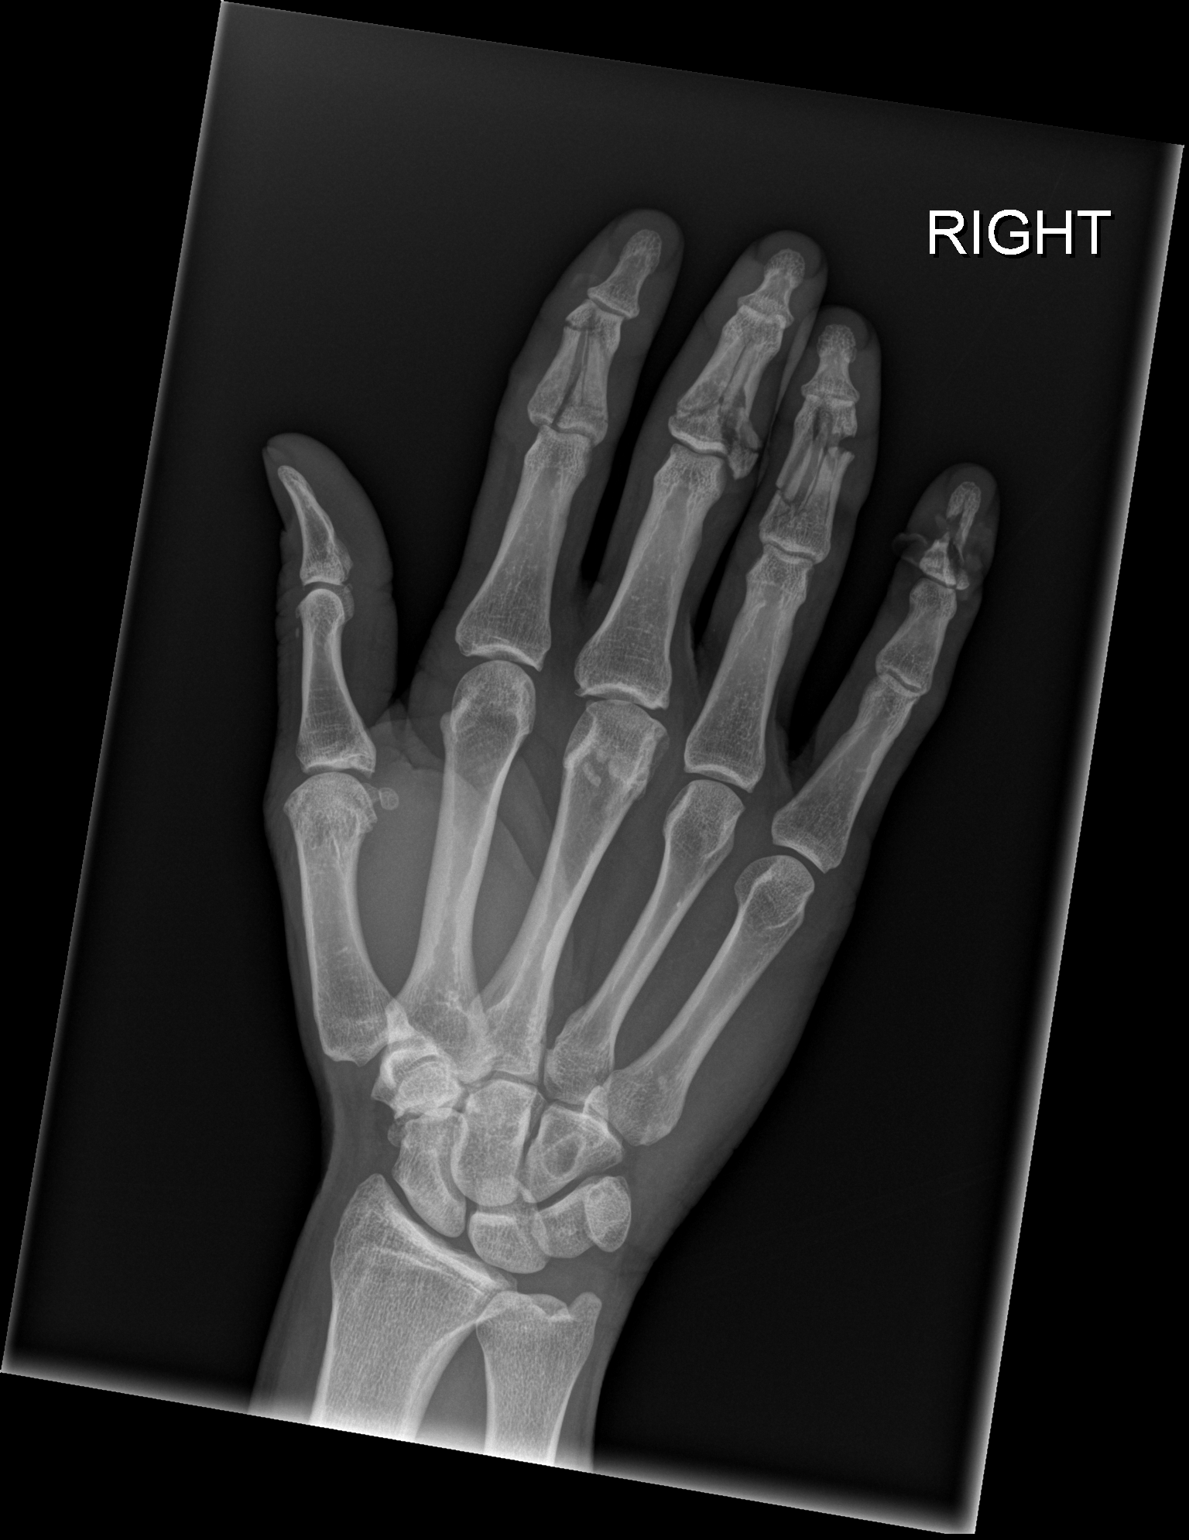

[x hand obl right]
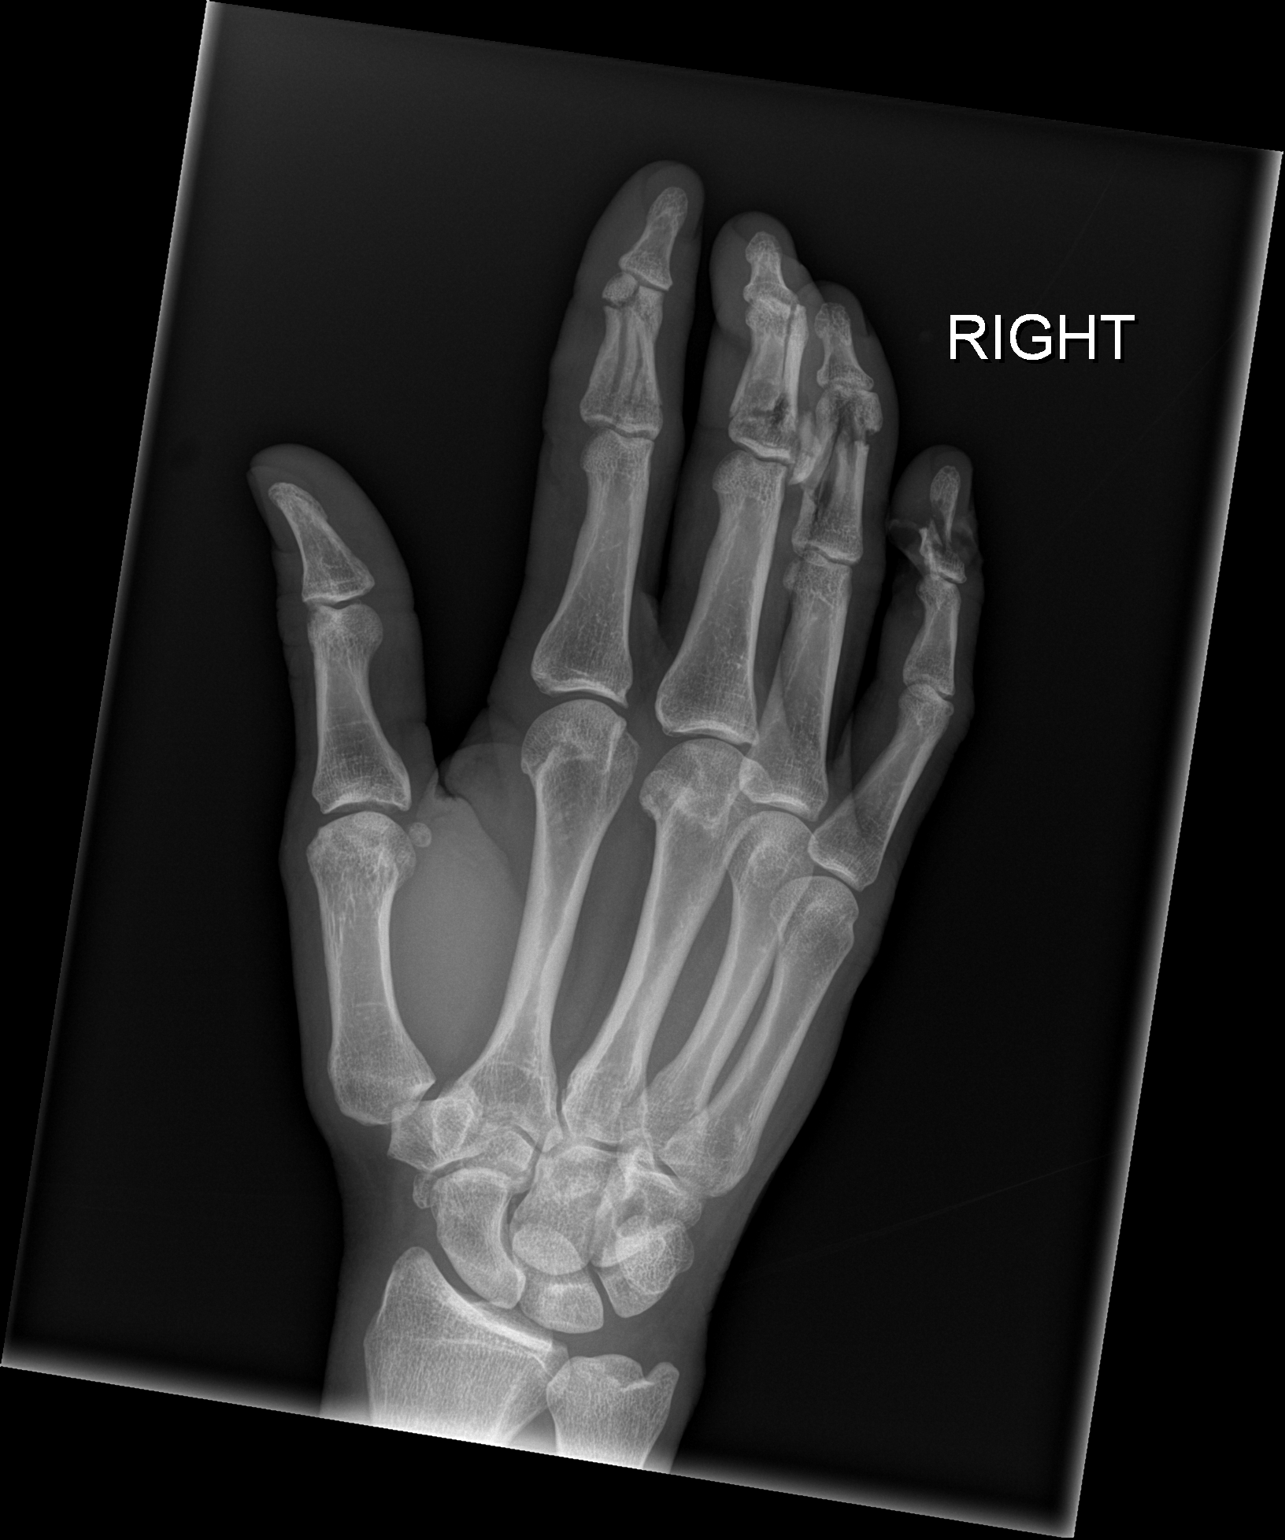

[x hand lat right]
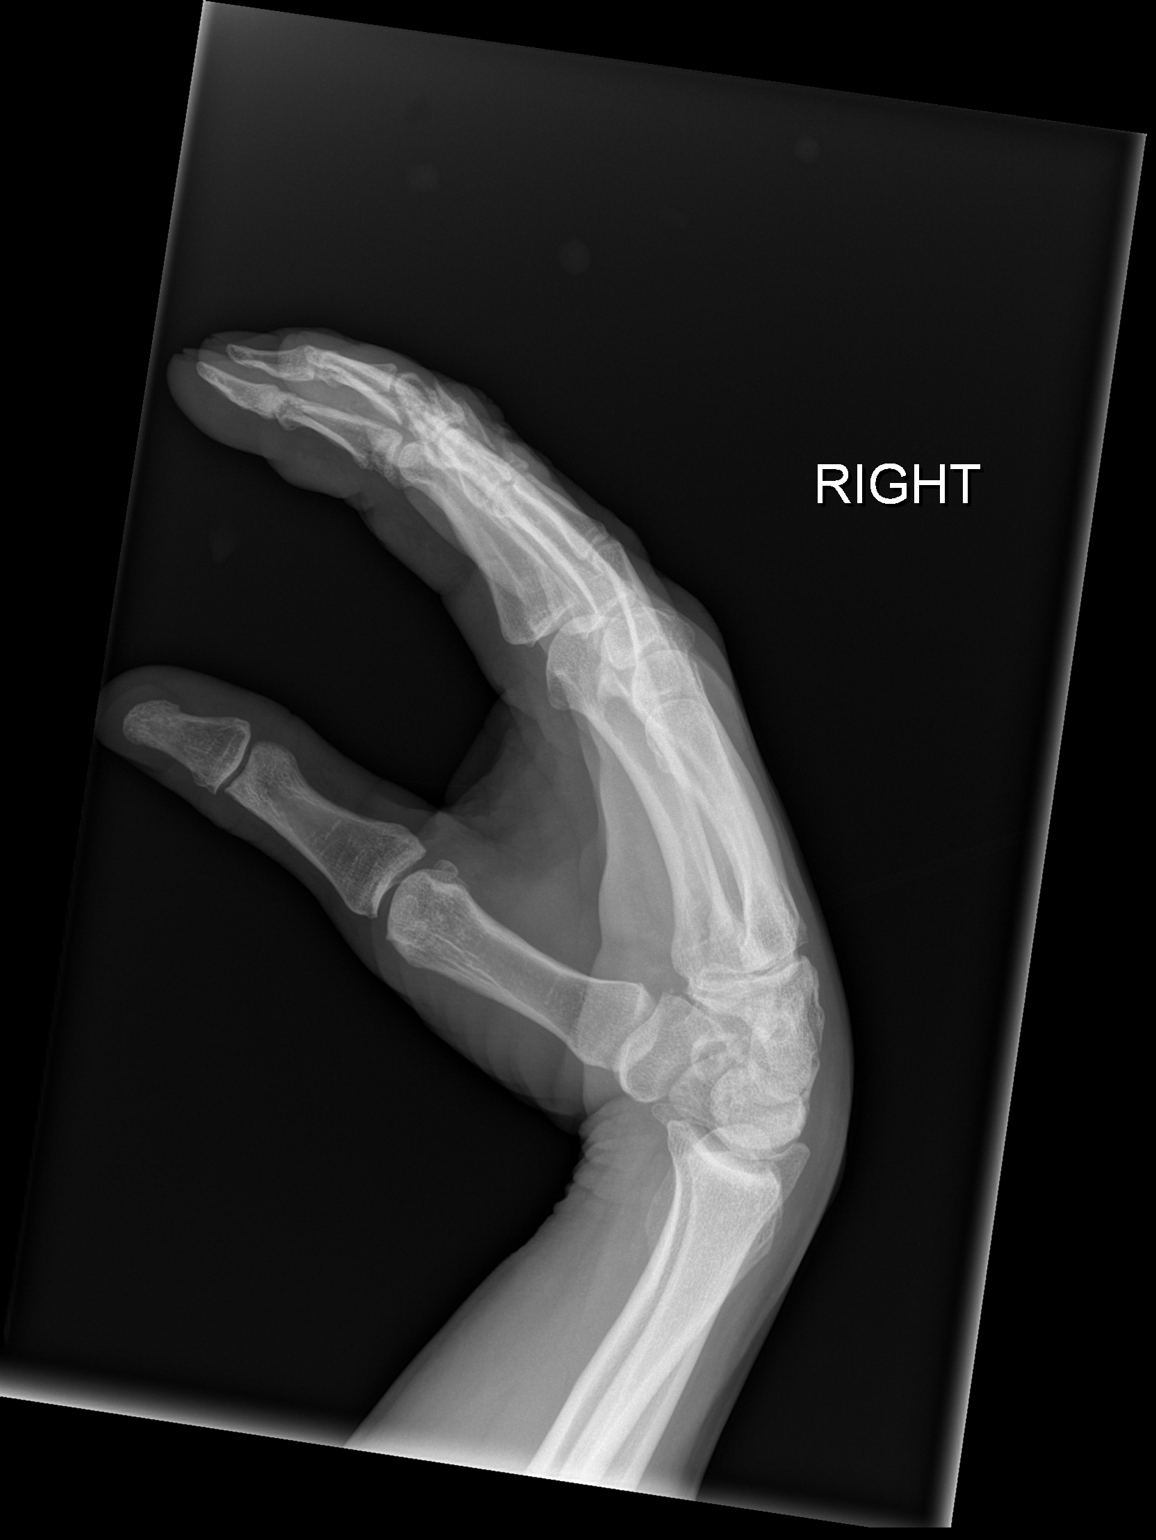

[3 of 3 positions shown; findings below may reference images not displayed]

FINDINGS: Comminuted fracture of the middle phalanx index finger
with extension to the proximal and distal articular surfaces,
minimal distraction.

Comminuted fracture of the middle phalanx long finger with
extension to the proximal articular   and distal articular
surfaces, and mild distraction of fracture fragments.

Comminuted fracture of the middle phalanx right ring finger with
extension to the distal articular surface and mild distraction of
fracture fragments

Comminuted fracture of the distal phalanx right little finger with
several millimeters distraction of fracture fragments, extension
proximally to the articular surface, and overlying soft tissue
injury suggesting open fracture.

The more proximal bones through the carpus are unremarkable with
normal mineralization and alignment.  No significant degenerative
change.
IMPRESSION: 1.  Phalangeal fractures as detailed above.

## 2013-10-07 ENCOUNTER — Emergency Department (HOSPITAL_COMMUNITY)
Admission: EM | Admit: 2013-10-07 | Discharge: 2013-10-07 | Disposition: A | Discharge status: Other Acute Inpatient Hospital | Payer: Medicaid Other | Attending: Emergency Medicine | Admitting: Emergency Medicine

## 2013-10-07 ENCOUNTER — Observation Stay (HOSPITAL_COMMUNITY)
Admission: EM | Admit: 2013-10-07 | Discharge: 2013-10-08 | Disposition: A | Discharge status: Home / Self Care | Payer: Medicaid Other | Source: Intra-hospital | Attending: Psychiatry | Admitting: Psychiatry

## 2013-10-07 ENCOUNTER — Encounter (HOSPITAL_COMMUNITY): Payer: Self-pay | Admitting: Emergency Medicine

## 2013-10-07 ENCOUNTER — Encounter (HOSPITAL_COMMUNITY): Payer: Self-pay | Admitting: *Deleted

## 2013-10-07 DIAGNOSIS — F17200 Nicotine dependence, unspecified, uncomplicated: Secondary | ICD-10-CM | POA: Diagnosis not present

## 2013-10-07 DIAGNOSIS — F1510 Other stimulant abuse, uncomplicated: Secondary | ICD-10-CM | POA: Insufficient documentation

## 2013-10-07 DIAGNOSIS — F1310 Sedative, hypnotic or anxiolytic abuse, uncomplicated: Secondary | ICD-10-CM | POA: Insufficient documentation

## 2013-10-07 DIAGNOSIS — F319 Bipolar disorder, unspecified: Secondary | ICD-10-CM | POA: Insufficient documentation

## 2013-10-07 DIAGNOSIS — Z8781 Personal history of (healed) traumatic fracture: Secondary | ICD-10-CM | POA: Insufficient documentation

## 2013-10-07 DIAGNOSIS — F411 Generalized anxiety disorder: Secondary | ICD-10-CM | POA: Diagnosis not present

## 2013-10-07 DIAGNOSIS — F11220 Opioid dependence with intoxication, uncomplicated: Secondary | ICD-10-CM

## 2013-10-07 DIAGNOSIS — F1010 Alcohol abuse, uncomplicated: Secondary | ICD-10-CM | POA: Diagnosis not present

## 2013-10-07 DIAGNOSIS — Z8719 Personal history of other diseases of the digestive system: Secondary | ICD-10-CM | POA: Diagnosis not present

## 2013-10-07 DIAGNOSIS — F1994 Other psychoactive substance use, unspecified with psychoactive substance-induced mood disorder: Secondary | ICD-10-CM | POA: Diagnosis not present

## 2013-10-07 DIAGNOSIS — F1910 Other psychoactive substance abuse, uncomplicated: Secondary | ICD-10-CM

## 2013-10-07 DIAGNOSIS — F10988 Alcohol use, unspecified with other alcohol-induced disorder: Secondary | ICD-10-CM | POA: Diagnosis not present

## 2013-10-07 DIAGNOSIS — F1120 Opioid dependence, uncomplicated: Secondary | ICD-10-CM | POA: Diagnosis present

## 2013-10-07 DIAGNOSIS — F1210 Cannabis abuse, uncomplicated: Secondary | ICD-10-CM | POA: Insufficient documentation

## 2013-10-07 DIAGNOSIS — Z79899 Other long term (current) drug therapy: Secondary | ICD-10-CM | POA: Diagnosis not present

## 2013-10-07 DIAGNOSIS — Z8739 Personal history of other diseases of the musculoskeletal system and connective tissue: Secondary | ICD-10-CM | POA: Diagnosis not present

## 2013-10-07 DIAGNOSIS — F1110 Opioid abuse, uncomplicated: Secondary | ICD-10-CM | POA: Diagnosis present

## 2013-10-07 DIAGNOSIS — F1123 Opioid dependence with withdrawal: Secondary | ICD-10-CM

## 2013-10-07 HISTORY — DX: Major depressive disorder, single episode, unspecified: F32.9

## 2013-10-07 HISTORY — DX: Bipolar disorder, unspecified: F31.9

## 2013-10-07 HISTORY — DX: Depression, unspecified: F32.A

## 2013-10-07 LAB — COMPREHENSIVE METABOLIC PANEL WITH GFR
ALT: 54 U/L — ABNORMAL HIGH (ref 0–53)
AST: 29 U/L (ref 0–37)
Albumin: 3.8 g/dL (ref 3.5–5.2)
Alkaline Phosphatase: 67 U/L (ref 39–117)
Anion gap: 9 (ref 5–15)
BUN: 15 mg/dL (ref 6–23)
CO2: 32 meq/L (ref 19–32)
Calcium: 10 mg/dL (ref 8.4–10.5)
Chloride: 99 meq/L (ref 96–112)
Creatinine, Ser: 0.97 mg/dL (ref 0.50–1.35)
GFR calc Af Amer: >90 mL/min
GFR calc non Af Amer: >90 mL/min
Glucose, Bld: 67 mg/dL — ABNORMAL LOW (ref 70–99)
Potassium: 5.2 meq/L (ref 3.7–5.3)
Sodium: 140 meq/L (ref 137–147)
Total Bilirubin: 0.5 mg/dL (ref 0.3–1.2)
Total Protein: 7.3 g/dL (ref 6.0–8.3)

## 2013-10-07 LAB — SALICYLATE LEVEL: Salicylate Lvl: <2 mg/dL — ABNORMAL LOW (ref 2.8–20.0)

## 2013-10-07 LAB — CBC
HEMATOCRIT: 45.6 % (ref 39.0–52.0)
HEMOGLOBIN: 15.5 g/dL (ref 13.0–17.0)
MCH: 31.8 pg (ref 26.0–34.0)
MCHC: 34 g/dL (ref 30.0–36.0)
MCV: 93.4 fL (ref 78.0–100.0)
Platelets: 230 10*3/uL (ref 150–400)
RBC: 4.88 MIL/uL (ref 4.22–5.81)
RDW: 13.1 % (ref 11.5–15.5)
WBC: 9.2 10*3/uL (ref 4.0–10.5)

## 2013-10-07 LAB — RAPID URINE DRUG SCREEN, HOSP PERFORMED
AMPHETAMINES: POSITIVE — AB
BARBITURATES: NOT DETECTED
Benzodiazepines: POSITIVE — AB
Cocaine: NOT DETECTED
Opiates: POSITIVE — AB
TETRAHYDROCANNABINOL: POSITIVE — AB

## 2013-10-07 LAB — ACETAMINOPHEN LEVEL

## 2013-10-07 LAB — ETHANOL: Alcohol, Ethyl (B): <11 mg/dL (ref 0–11)

## 2013-10-07 MED ORDER — MAGNESIUM HYDROXIDE 400 MG/5ML PO SUSP: 30.0000 mL | Freq: Every day | ORAL | Status: DC | PRN

## 2013-10-07 MED ORDER — NAPROXEN 500 MG PO TABS: 500.0000 mg | ORAL_TABLET | Freq: Two times a day (BID) | ORAL | Status: DC | PRN

## 2013-10-07 MED ORDER — FLUOXETINE HCL 20 MG PO TABS
60.0000 mg | ORAL_TABLET | Freq: Every day | ORAL | Status: DC
  Administered 2013-10-08: 60 mg via ORAL
  Filled 2013-10-07 (×3): qty 3

## 2013-10-07 MED ORDER — ALUM & MAG HYDROXIDE-SIMETH 200-200-20 MG/5ML PO SUSP: 30.0000 mL | ORAL | Status: DC | PRN

## 2013-10-07 MED ORDER — ACETAMINOPHEN 325 MG PO TABS: 650.0000 mg | ORAL_TABLET | Freq: Four times a day (QID) | ORAL | Status: DC | PRN

## 2013-10-07 MED ORDER — GABAPENTIN 300 MG PO CAPS
300.0000 mg | ORAL_CAPSULE | Freq: Four times a day (QID) | ORAL | Status: DC
  Administered 2013-10-07 – 2013-10-08 (×3): 300 mg via ORAL
  Filled 2013-10-07 (×5): qty 1
  Filled 2013-10-07: qty 3
  Filled 2013-10-07 (×2): qty 1

## 2013-10-07 MED ORDER — METHOCARBAMOL 500 MG PO TABS: 500.0000 mg | ORAL_TABLET | Freq: Three times a day (TID) | ORAL | Status: DC | PRN

## 2013-10-07 MED ORDER — ONDANSETRON 4 MG PO TBDP: 4.0000 mg | ORAL_TABLET | Freq: Four times a day (QID) | ORAL | Status: DC | PRN

## 2013-10-07 MED ORDER — DICYCLOMINE HCL 20 MG PO TABS: 20.0000 mg | ORAL_TABLET | Freq: Four times a day (QID) | ORAL | Status: DC | PRN

## 2013-10-07 MED ORDER — LOPERAMIDE HCL 2 MG PO CAPS: 2.0000 mg | ORAL_CAPSULE | ORAL | Status: DC | PRN

## 2013-10-07 MED ORDER — TRAZODONE HCL 50 MG PO TABS
50.0000 mg | ORAL_TABLET | Freq: Every evening | ORAL | Status: DC | PRN
  Administered 2013-10-07: 50 mg via ORAL
  Filled 2013-10-07 (×5): qty 1

## 2013-10-07 NOTE — ED Provider Notes (Signed)
CSN: [id number]     Arrival date & time 10/07/13  1601 History   First MD Initiated Contact with Patient 10/07/13 1816     Chief Complaint  Patient presents with  . Addiction Problem     HPI [name] [name] is a 38 y.o. male with PMH of depression, bipolar and anxiety presenting for detox today. He admits to using heroin IV this morning and at least  Xanax yesterday. He uses 2 mg Xanax daily. He gets this off the street. He denies suicidal ideation, homicidal ideation, self-harm or wanting to harm others. He denies hallucinations alcohol use or other drug use today.  He denies chest pain abdominal pain back pain. He has no other complaints today.   Past Medical History  Diagnosis Date  . GERD (gastroesophageal reflux disease)   . Arthritis     right hand  . Anxiety   . Fluctuating blood pressure     no current med.  . Dental crowns present   . Dental bridge present     lower  . Abrasion of leg 06/29/2012    abrasions bilateral leg  . 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 Bipolar 1 disorder   . Depression    Past Surgical History  Procedure Laterality Date  . Orif ankle fracture Right   . I&d extremity  08/30/2011    Procedure: IRRIGATION AND DEBRIDEMENT EXTREMITY;  Surgeon: Tami Ribas, MD;  Location: [facility] OR;  Service: Orthopedics;  Laterality: Right;  . Amputation  08/30/2011    Procedure: AMPUTATION DIGIT;  Surgeon: Tami Ribas, MD;  Location: [facility] OR;  Service: Orthopedics;  Laterality: Right;  partial  . Artery repair  08/30/2011    Procedure: ARTERY REPAIR;  Surgeon: Tami Ribas, MD;  Location: [facility] OR;  Service: Orthopedics;  Laterality: Right;  . Laceration repair  08/30/2011    Procedure: REPAIR MULTIPLE LACERATIONS;  Surgeon: Tami Ribas, MD;  Location: [facility] OR;  Service: Orthopedics;  Laterality: Right;  multiple fingers crush injury  . Open reduction internal fixation (orif) finger with radial bone graft Right 07/03/2012    Procedure: OPEN  REDUCTION INTERNAL FIXATION (ORIF) LONG FINGER WITH BONE GRAFT;  Surgeon: Tami Ribas, MD;  Location: [facility];  Service: Orthopedics;  Laterality: Right;  . Carpal tunnel release Right 07/03/2012    Procedure: CARPAL TUNNEL RELEASE;  Surgeon: Tami Ribas, MD;  Location: [facility];  Service: Orthopedics;  Laterality: Right;   No family history on file. History  Substance Use Topics  . Smoking status: Current Every Day Smoker -- 0.50 packs/day for 20 years    Types: Cigarettes  . Smokeless tobacco: Former Neurosurgeon  . Alcohol Use: Yes     Comment: 6 beers on weekend    Review of Systems  Constitutional: Negative for fever and chills.  HENT: Negative for congestion and rhinorrhea.   Eyes: Negative for visual disturbance.  Respiratory: Negative for cough and shortness of breath.   Cardiovascular: Negative for chest pain and palpitations.  Gastrointestinal: Negative for nausea, vomiting and diarrhea.  Genitourinary: Negative for dysuria and hematuria.  Musculoskeletal: Negative for back pain and gait problem.  Skin: Negative for rash.  Neurological: Negative for weakness and headaches.      Allergies  Review of patient's allergies indicates no known allergies.  Home Medications   Prior to Admission medications   Medication Sig Start Date End Date Taking?  Authorizing Provider  ALPRAZolam Prudy Feeler) 1 MG tablet Take 1 mg by mouth at bedtime.   Yes Historical Provider, MD  FLUoxetine (PROZAC) 20 MG tablet Take 60 mg by mouth daily.   Yes Historical Provider, MD  gabapentin (NEURONTIN) 300 MG capsule Take 300 mg by mouth 4 (four) times daily.   Yes Historical Provider, MD   BP 122/64  Pulse 64  Temp(Src) 98.3 F (36.8 C) (Oral)  Resp 16  Wt 175 lb (79.379 kg)  SpO2 97% Physical Exam  Nursing note and vitals reviewed. Constitutional: He appears well-developed and well-nourished. No distress.  HENT:  Head: Normocephalic and atraumatic.  Eyes:  Conjunctivae and EOM are normal. Right eye exhibits no discharge. Left eye exhibits no discharge. No scleral icterus.  Cardiovascular: Normal rate, regular rhythm and normal heart sounds.   Pulmonary/Chest: Effort normal and breath sounds normal. No respiratory distress. He has no wheezes.  Abdominal: Soft. Bowel sounds are normal. He exhibits no distension. There is no tenderness.  Musculoskeletal: Normal range of motion. He exhibits no tenderness.  Normal gait  Neurological: He is alert. He exhibits normal muscle tone. Coordination normal.  Skin: Skin is warm and dry. He is not diaphoretic.  Injection marks in bilateral hand antecubital fossa without signs of infection  Psychiatric: He has a normal mood and affect. His behavior is normal.    ED Course  Procedures (including critical care time) Labs Review Labs Reviewed  COMPREHENSIVE METABOLIC PANEL - Abnormal; Notable for the following:    Glucose, Bld 67 (*)    ALT 54 (*)    All other components within normal limits  SALICYLATE LEVEL - Abnormal; Notable for the following:    Salicylate Lvl <2.0 (*)    All other components within normal limits  URINE RAPID DRUG SCREEN (HOSP PERFORMED) - Abnormal; Notable for the following:    Opiates POSITIVE (*)    Benzodiazepines POSITIVE (*)    Amphetamines POSITIVE (*)    Tetrahydrocannabinol POSITIVE (*)    All other components within normal limits  ACETAMINOPHEN LEVEL  CBC  ETHANOL    Imaging Review No results found.   EKG Interpretation None     Meds given in ED:  Medications - No data to display  New Prescriptions   No medications on file      MDM   Final diagnoses:  Substance abuse   Patient presenting for detox from heroin and Xanax. She is hoping to detox and he can go into a longer-term placement for addiction problems. Patient denies suicidal ideation homicidal ideation or self-harm. He is not hallucinating. Patient has no other complaints today. Laboratory  workup unremarkable. Patient is cooperative. Consult TTS.   Admit to Behavior health unit to Dr. Netty Starring service.     Louann Sjogren, PA-C 10/07/13 2025

## 2013-10-07 NOTE — ED Provider Notes (Signed)
Accepted by Dr. Tobie Poet at [facility] obs unit  Vida Roller, MD 10/07/13 Mikle Bosworth

## 2013-10-07 NOTE — BH Assessment (Signed)
Assessment Note  [name] [name] is an 38 y.o. male who presents seeking detox.  He reports that he uses between half a gram and 2 grams of heroin daily.  He reports feeling no control over his heroin use.  He states that he wakes up and needs it immediately and if he didn't have money he would pawn something.  He also abuses xanax, marijuana, and alcohol.  He reports constant feelings of depression, which he felt he were under control at one point and then his medications were changed and he feels like he's just been struggling to find something else that helps.  He reports that he's got multiple stressors including his father's death and his mother's failing health.  His 60 year old daughter is living in the home.  He denies SI, but endorses depression including feelings of worthlessness, irritability, anxiety, tearfulness, anhedonia, and fatigue.  He states he sleeps around 3 hours per night and has lost approximately 20-30 lbs.  He denies HI, history of violence, AVH or delusional thoughts.  He is calm and cooperative and appropriate for treatment.     Axis I: Substance Abuse and Substance Induced Mood Disorder Axis II: Deferred Axis III:  Past Medical History  Diagnosis Date  . GERD (gastroesophageal reflux disease)   . Arthritis     right hand  . Anxiety   . Fluctuating blood pressure     no current med.  . Dental crowns present   . Dental bridge present     lower  . Abrasion of leg 06/29/2012    abrasions bilateral leg  . 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 Bipolar 1 disorder   . Depression    Axis IV: other psychosocial or environmental problems and problems with primary support group Axis V: 41-50 serious symptoms  Past Medical History:  Past Medical History  Diagnosis Date  . GERD (gastroesophageal reflux disease)   . Arthritis     right hand  . Anxiety   . Fluctuating blood pressure     no current med.  . Dental crowns present   . Dental  bridge present     lower  . Abrasion of leg 06/29/2012    abrasions bilateral leg  . 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 Bipolar 1 disorder   . Depression     Past Surgical History  Procedure Laterality Date  . Orif ankle fracture Right   . I&d extremity  08/30/2011    Procedure: IRRIGATION AND DEBRIDEMENT EXTREMITY;  Surgeon: Tami Ribas, MD;  Location: [facility] OR;  Service: Orthopedics;  Laterality: Right;  . Amputation  08/30/2011    Procedure: AMPUTATION DIGIT;  Surgeon: Tami Ribas, MD;  Location: [facility] OR;  Service: Orthopedics;  Laterality: Right;  partial  . Artery repair  08/30/2011    Procedure: ARTERY REPAIR;  Surgeon: Tami Ribas, MD;  Location: [facility] OR;  Service: Orthopedics;  Laterality: Right;  . Laceration repair  08/30/2011    Procedure: REPAIR MULTIPLE LACERATIONS;  Surgeon: Tami Ribas, MD;  Location: [facility] OR;  Service: Orthopedics;  Laterality: Right;  multiple fingers crush injury  . Open reduction internal fixation (orif) finger with radial bone graft Right 07/03/2012    Procedure: OPEN REDUCTION INTERNAL FIXATION (ORIF) LONG FINGER WITH BONE GRAFT;  Surgeon: Tami Ribas, MD;  Location: [facility];  Service: Orthopedics;  Laterality: Right;  . Carpal tunnel release Right 07/03/2012    Procedure: CARPAL TUNNEL RELEASE;  Surgeon: Tami Ribas, MD;  Location: [facility];  Service: Orthopedics;  Laterality: Right;    Family History: No family history on file.  Social History:  reports that he has been smoking Cigarettes.  He has a 10 pack-year smoking history. He has quit using smokeless tobacco. He reports that he drinks alcohol. He reports that he does not use illicit drugs.  Additional Social History:  Alcohol / Drug Use History of alcohol / drug use?: Yes Longest period of sobriety (when/how long): 9-10 Substance #1 Name of Substance 1: Heroin 1 - Age of First Use: 36-heroin, other opiates age 47 1 -  Amount (size/oz): half gram -2 grams 1 - Frequency: daily 1 - Duration: ongoing 2 years 1 - Last Use / Amount: 10/07/13 half gram Substance #2 Name of Substance 2: Marijuana 2 - Age of First Use: 15 2 - Amount (size/oz): unsure 2 - Frequency: daily 2 - Duration: ongoing Substance #3 Name of Substance 3: Xanax 3 - Age of First Use: 15 3 - Amount (size/oz):  3 - Frequency: daily 3 - Duration: 2 years 3 - Last Use / Amount: 10/06/13 1 mg Substance #4 Name of Substance 4: Alcohol 4 - Age of First Use: 14 4 - Amount (size/oz): 5-6 mixed drinks 4 - Frequency: 4-5 days per week 4 - Duration: 2-3 years 4 - Last Use / Amount: 10/05/13  CIWA: CIWA-Ar BP: 123/79 mmHg Pulse Rate: 73 Nausea and Vomiting: mild nausea with no vomiting Tactile Disturbances: none Tremor: not visible, but can be felt fingertip to fingertip Auditory Disturbances: not present Paroxysmal Sweats: barely perceptible sweating, palms moist Visual Disturbances: not present Anxiety: no anxiety, at ease Headache, Fullness in Head: none present Agitation: normal activity Orientation and Clouding of Sensorium: oriented and can do serial additions CIWA-Ar Total: 3 COWS: Clinical Opiate Withdrawal Scale (COWS) Resting Pulse Rate: Pulse Rate 80 or below Sweating: No report of chills or flushing Restlessness: Able to sit still Pupil Size: Pupils pinned or normal size for room light Bone or Joint Aches: Not present Runny Nose or Tearing: Not present GI Upset: No GI symptoms Tremor: No tremor Yawning: No yawning Anxiety or Irritability: None Gooseflesh Skin: Skin is smooth COWS Total Score: 0  Allergies: No Known Allergies  Home Medications:  (Not in a hospital admission)  OB/GYN Status:  No LMP for male patient.  General Assessment Data Location of Assessment: [facility] Is this a Tele or Face-to-Face Assessment?: Tele Assessment Is this an Initial Assessment or a Re-assessment for this encounter?: Initial  Assessment Living Arrangements: Children (45 year old daughter) Can pt return to current living arrangement?: Yes Admission Status: Voluntary Is patient capable of signing voluntary admission?: Yes Transfer from: Home Referral Source: Self/Family/Friend     [facility] Crisis Care Plan Living Arrangements: Children (40 year old daughter)  Education Status Is patient currently in school?: No Highest grade of school patient has completed: High School  Risk to self with the past 6 months Suicidal Ideation: No Suicidal Intent: No Is patient at risk for suicide?: No Suicidal Plan?: No Access to Means: No What has been your use of drugs/alcohol within the last 12 months?: ongoing Previous Attempts/Gestures: No How many times?: 0 Intentional Self Injurious Behavior: None Family Suicide History: No Recent stressful life event(s): Divorce;Loss (Comment) (father died, mother in poor health) Persecutory voices/beliefs?: No Depression: Yes Depression Symptoms: Feeling  worthless/self pity;Feeling angry/irritable;Loss of interest in usual pleasures;Guilt;Tearfulness;Isolating;Insomnia;Despondent;Fatigue Substance abuse history and/or treatment for substance abuse?: Yes Suicide prevention information given to non-admitted patients: Not applicable  Risk to Others within the past 6 months Homicidal Ideation: No Thoughts of Harm to Others: No Current Homicidal Intent: No Current Homicidal Plan: No Access to Homicidal Means: No History of harm to others?: No Assessment of Violence: None Noted Does patient have access to weapons?: No Criminal Charges Pending?: No Does patient have a court date: No  Psychosis Hallucinations: None noted Delusions: None noted  Mental Status Report Appear/Hygiene: Disheveled Eye Contact: Fair Motor Activity: Freedom of movement Speech: Logical/coherent Level of Consciousness: Alert Mood: Depressed Affect: Appropriate to circumstance Anxiety Level: Panic  Attacks Panic attack frequency: daily Most recent panic attack: 10/07/13 10 am Thought Processes: Coherent;Relevant Judgement: Unimpaired Orientation: Person;Place;Time;Situation Obsessive Compulsive Thoughts/Behaviors: None  Cognitive Functioning Concentration: Decreased Memory: Remote Intact;Recent Intact IQ: Average Insight: Good Impulse Control: Good Appetite: Poor Weight Loss: 25 Weight Gain: 0 Sleep: Decreased Total Hours of Sleep: 3 Vegetative Symptoms: Decreased grooming;Staying in bed  ADLScreening [facility] Assessment Services) Patient's cognitive ability adequate to safely complete daily activities?: Yes Patient able to express need for assistance with ADLs?: Yes Independently performs ADLs?: Yes (appropriate for developmental age)  Prior Inpatient Therapy Prior Inpatient Therapy: Yes Prior Therapy Dates: June 2015, March 2015 Prior Therapy Facilty/Provider(s): [facility] Reason for Treatment: SA  Prior Outpatient Therapy Prior Outpatient Therapy: Yes Prior Therapy Dates: a couple of times Prior Therapy Facilty/Provider(s): [facility] Reason for Treatment: SA  ADL Screening (condition at time of admission) Patient's cognitive ability adequate to safely complete daily activities?: Yes Patient able to express need for assistance with ADLs?: Yes Independently performs ADLs?: Yes (appropriate for developmental age)       Abuse/Neglect Assessment (Assessment to be complete while patient is alone) Physical Abuse: Denies Verbal Abuse: Denies Sexual Abuse: Denies     Advance Directives (For Healthcare) Does patient have an advance directive?: No Would patient like information on creating an advanced directive?: No - patient declined information Nutrition Screen- [facility] Adult/[facility]/AP Patient's home diet: Regular  Additional Information 1:1 In Past 12 Months?: No CIRT Risk: No Elopement Risk: No Does patient have medical clearance?: Yes      Disposition:  Disposition Initial Assessment Completed for this Encounter: Yes Disposition of Patient: Inpatient treatment program  On Site Evaluation by:   Reviewed with Physician:    Steward Ros 10/07/2013 6:33 PM

## 2013-10-07 NOTE — ED Notes (Signed)
PATIENT REPORTS HE HAS BEEN USING IV HEROIN SINCE CRUSHING HIS HAND ABOUT 2.5 YEARS AGO. STATES STARTED USING HEROIN WHEN RX FOR PAIN PILLS STOPPED. STATES PRIOR TO HIS INJURY HE WAS CLEAN FOR ABOUT 9 MONTHS. STATES HE HAS BEEN USING ABOUT .5 GRAMS DAILY FOR ABOUT 2 YEARS. STATES THAT HE HAS ALSO BEEN USING ABOUT 2 MG OF XANAX DAILY FOR THE PAST YEAR. STATES HE IS NOT PRESCRIBED THE XANAX, HE BUYS IT OFF THE STREET. STATES HE HAS A PRESCRIPTON FOR PROZAC BUT IT DOESN'T HELP HIS ANXIETY MUCH. STATES HE IS HAVING A DIFFICULT TIME BECAUSE HIS FATHER HAS PARKINSONS AND HE IS HAVING TROUBLE DEALING WITH IT. STATES LAST USED .5 GRAM OF HEROIN ABOUT NOON TODAY. STATES LAST XANAX WAS YESTERDAY. HE DENIES SI/HI

## 2013-10-07 NOTE — ED Notes (Signed)
Pt here requesting detox from heroin and xanax. Denies SI or HI. Reports has been having a bad temper lately. Is calm and cooperative at current.

## 2013-10-07 NOTE — Progress Notes (Signed)
Pt admitted to Observation unit. Pt requesting heroin detox reports using up to 2 grams daily. He also admits to abusing xanax, marijuana and alcohol. He reports that he uses between half a gram and 2 grams of heroin daily. C/o depression r/t multiple stressors daughter in college, fathers death and mother illness. Denies SI/HI . Verbally contracts for safety. -A/[place]. Will monitor closely and evaluate for stabilization.

## 2013-10-07 NOTE — Progress Notes (Signed)
[facility] INPATIENT:  Family/Significant Other Suicide Prevention Education  Suicide Prevention Education:  Patient Refusal for Family/Significant Other Suicide Prevention Education: The patient [name] [name] has refused to provide written consent for family/significant other to be provided Family/Significant Other Suicide Prevention Education during admission and/or prior to discharge.    Celene Kras 10/07/2013, 10:02 PM

## 2013-10-07 NOTE — Plan of Care (Signed)
[facility] Observation Crisis Plan  Reason for Crisis Plan:  Substance Abuse   Plan of Care:  Referral for Substance Abuse  Family Support:    mother  Current Living Environment:  Living Arrangements: Children (mostly alone daughter in college)  Insurance:   Hospital Account   Name Acct ID Class Status Primary Coverage   [name] [name] A [id number] BEHAVIORAL HEALTH OBSERVATION [place]        Guarantor Account (for Hospital Account [id number])   Name Relation to Pt Service Area Active? Acct Type   [name] [name] Small Self [facility] Yes [facility]   Address Phone       [address] [place], [place] [address] [phone](H)          Coverage Information (for Hospital Account [id number])   F/O Payor/Plan Precert #   [facility] [place]    Subscriber Subscriber #   [name] [name] [id number]   Address Phone   [address] [place], [place] [address] [phone]      Legal Guardian:     Primary Care Provider:  No PCP Per Patient Dr. Mordecai Maes [place]  Current Outpatient Providers:  [facility]  Psychiatrist:   Dr. Reece Agar pt unable to spell or say name  Counselor/Therapist:   [facility]  Compliant with Medications:  Yes  Additional Information:   [name] [name] 9/14/201510:03 PM

## 2013-10-08 DIAGNOSIS — F1994 Other psychoactive substance use, unspecified with psychoactive substance-induced mood disorder: Secondary | ICD-10-CM | POA: Diagnosis not present

## 2013-10-08 DIAGNOSIS — F1910 Other psychoactive substance abuse, uncomplicated: Secondary | ICD-10-CM

## 2013-10-08 MED ORDER — GABAPENTIN 300 MG PO CAPS
ORAL_CAPSULE | ORAL | Status: AC
  Administered 2013-10-08: 12:00:00
  Filled 2013-10-08: qty 1

## 2013-10-08 NOTE — Discharge Instructions (Signed)
To help you maintain a sober lifestyle a residential substance abuse treatment program may be helpful to you.  [facility]:       [facility]      [address]      [place], [place] [address]      [phone]

## 2013-10-08 NOTE — ED Provider Notes (Signed)
Medical screening examination/treatment/procedure(s) were performed by non-physician practitioner and as supervising physician I was immediately available for consultation/collaboration.    Vida Roller, MD 10/08/13 1005

## 2013-10-08 NOTE — H&P (Signed)
OBS UNIT H&P   Subjective: Pt seen and chart reviewed. Pt denies SI, HI, AVH, contracts for safety. Pt reports that he has been using up to 2g daily of heroin. Pt would like residential but is open to discharging home to followup on this on his own if placement cannot be secured today by 4:30PM.    HPI: [name] [name] is an 38 y.o. male who presents seeking detox.  He reports that he uses between half a gram and 2 grams of heroin daily.  He reports feeling no control over his heroin use.  He states that he wakes up and needs it immediately and if he didn't have money he would pawn something.  He also abuses xanax, marijuana, and alcohol.  He reports constant feelings of depression, which he felt he were under control at one point and then his medications were changed and he feels like he's just been struggling to find something else that helps.  He reports that he's got multiple stressors including his father's death and his mother's failing health.  His 5 year old daughter is living in the home.  He denies SI, but endorses depression including feelings of worthlessness, irritability, anxiety, tearfulness, anhedonia, and fatigue.  He states he sleeps around 3 hours per night and has lost approximately 20-30 lbs.  He denies HI, history of violence, AVH or delusional thoughts.  He is calm and cooperative and appropriate for treatment.     *Pt stayed in OBS Overnight without incident.  Denies withdrawal symptoms, COWS, CIWA, both low scores under 5.   Axis I: Substance Abuse and Substance Induced Mood Disorder Axis II: Deferred Axis III:  Past Medical History  Diagnosis Date  . GERD (gastroesophageal reflux disease)   . Arthritis     right hand  . Anxiety   . Fluctuating blood pressure     no current med.  . Dental crowns present   . Dental bridge present     lower  . Abrasion of leg 06/29/2012    abrasions bilateral leg  . 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  Bipolar 1 disorder   . Depression    Axis IV: other psychosocial or environmental problems and problems with primary support group Axis V: 41-50 serious symptoms  Past Medical History:  Past Medical History  Diagnosis Date  . GERD (gastroesophageal reflux disease)   . Arthritis     right hand  . Anxiety   . Fluctuating blood pressure     no current med.  . Dental crowns present   . Dental bridge present     lower  . Abrasion of leg 06/29/2012    abrasions bilateral leg  . 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 Bipolar 1 disorder   . Depression     Past Surgical History  Procedure Laterality Date  . Orif ankle fracture Right   . I&d extremity  08/30/2011    Procedure: IRRIGATION AND DEBRIDEMENT EXTREMITY;  Surgeon: Tami Ribas, MD;  Location: [facility] OR;  Service: Orthopedics;  Laterality: Right;  . Amputation  08/30/2011    Procedure: AMPUTATION DIGIT;  Surgeon: Tami Ribas, MD;  Location: [facility] OR;  Service: Orthopedics;  Laterality: Right;  partial  . Artery repair  08/30/2011    Procedure: ARTERY REPAIR;  Surgeon: Tami Ribas, MD;  Location: [facility] OR;  Service: Orthopedics;  Laterality: Right;  .  Laceration repair  08/30/2011    Procedure: REPAIR MULTIPLE LACERATIONS;  Surgeon: Tami Ribas, MD;  Location: [facility] OR;  Service: Orthopedics;  Laterality: Right;  multiple fingers crush injury  . Open reduction internal fixation (orif) finger with radial bone graft Right 07/03/2012    Procedure: OPEN REDUCTION INTERNAL FIXATION (ORIF) LONG FINGER WITH BONE GRAFT;  Surgeon: Tami Ribas, MD;  Location: [facility];  Service: Orthopedics;  Laterality: Right;  . Carpal tunnel release Right 07/03/2012    Procedure: CARPAL TUNNEL RELEASE;  Surgeon: Tami Ribas, MD;  Location: [facility];  Service: Orthopedics;  Laterality: Right;    Family History: History reviewed. No pertinent family history.  Social History:  reports that he has  been smoking Cigarettes.  He has a 10 pack-year smoking history. He has quit using smokeless tobacco. He reports that he drinks alcohol. He reports that he does not use illicit drugs.   CIWA: CIWA-Ar BP: 109/58 mmHg Pulse Rate: 57 Nausea and Vomiting: no nausea and no vomiting Tactile Disturbances: none Tremor: not visible, but can be felt fingertip to fingertip Auditory Disturbances: not present Paroxysmal Sweats: barely perceptible sweating, palms moist Visual Disturbances: not present Anxiety: mildly anxious Headache, Fullness in Head: none present Agitation: normal activity Orientation and Clouding of Sensorium: oriented and can do serial additions CIWA-Ar Total: 3 COWS: Clinical Opiate Withdrawal Scale (COWS) Resting Pulse Rate: Pulse Rate 80 or below Sweating: No report of chills or flushing Restlessness: Able to sit still Pupil Size: Pupils pinned or normal size for room light Bone or Joint Aches: Not present Runny Nose or Tearing: Not present GI Upset: Stomach cramps Tremor: Tremor can be felt, but not observed Yawning: No yawning Anxiety or Irritability: Patient reports increasing irritability or anxiousness Gooseflesh Skin: Skin is smooth COWS Total Score: 3  Allergies: No Known Allergies  Home Medications:  Medications Prior to Admission  Medication Sig Dispense Refill  . ALPRAZolam (XANAX) 1 MG tablet Take 1 mg by mouth at bedtime.      Marland Kitchen FLUoxetine (PROZAC) 20 MG tablet Take 60 mg by mouth daily.      Marland Kitchen gabapentin (NEURONTIN) 300 MG capsule Take 300 mg by mouth 4 (four) times daily.       Plan: Discharge home with outpatient referrals if inpatient rehab placement cannot be secured by 4:30PM. Tom with [facility] is attempting to find residential at this time. Pt in agreement to discharge home and followup on his own if this cannot be accomplished.    Beau Fanny, FNP-BC 10/08/2013 10:34 AM Personally evaluated the patient and agree with assessment and  plan Madie Reno A. Dub Mikes, M.D.

## 2013-10-08 NOTE — [facility] Suicide Risk Assessment (Signed)
Suicide Risk Assessment  Discharge Assessment     Demographic Factors:  Male and Caucasian  Total Time spent with patient: 30 minutes  Psychiatric Specialty Exam:     Blood pressure 126/88, pulse 70, temperature 98.8 F (37.1 C), temperature source Oral, resp. rate 18, height  (1.803 m), weight 78.472 kg (173 lb).Body mass index is 24.14 kg/(m^2).  General Appearance: Fairly Groomed  Patent attorney::  Fair  Speech:  Clear and Coherent  Volume:  Normal  Mood:  Anxious  Affect:  anxious  Thought Process:  Coherent and Goal Directed  Orientation:  Full (Time, Place, and Person)  Thought Content:  sympomts, relapse prevention plan  Suicidal Thoughts:  No  Homicidal Thoughts:  No  Memory:  Immediate;   Fair Recent;   Fair Remote;   Fair  Judgement:  Fair  Insight:  Present  Psychomotor Activity:  Normal  Concentration:  Fair  Recall:  [employer] of Knowledge:NA  Language: Fair  Akathisia:  No  Handed:    AIMS (if indicated):     Assets:  Desire for Improvement Social Support  Sleep:       Musculoskeletal: Strength & Muscle Tone: within normal limits Gait & Station: normal Patient leans: N/A   Mental Status Per Nursing Assessment::   On Admission:  NA  Current Mental Status by Physician: In full contact with reality. There are no active S/S of withdrawal. No active SI, plans or intent. He states he knows he might still experience some withdrawal later on, but states he will be with his mother and that he will be able to handle it. Will seek out residential treatment options   Loss Factors: NA  Historical Factors: NA  Risk Reduction Factors:   Sense of responsibility to family, Living with another person, especially a relative and Positive social support  Continued Clinical Symptoms:  Alcohol/Substance Abuse/Dependencies  Cognitive Features That Contribute To Risk: None identified   Suicide Risk:  Minimal: No identifiable suicidal ideation.  Patients  presenting with no risk factors but with morbid ruminations; may be classified as minimal risk based on the severity of the depressive symptoms  Discharge Diagnoses:   AXIS I:  Opioid Dependence/withdrawal AXIS II:  No diagnosis AXIS III:   Past Medical History  Diagnosis Date  . GERD (gastroesophageal reflux disease)   . Arthritis     right hand  . Anxiety   . Fluctuating blood pressure     no current med.  . Dental crowns present   . Dental bridge present     lower  . Abrasion of leg 06/29/2012    abrasions bilateral leg  . 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 Bipolar 1 disorder   . Depression    AXIS IV:  other psychosocial or environmental problems AXIS V:  61-70 mild symptoms  Plan Of Care/Follow-up recommendations:  Activity:  as tolerated Diet:  regular Follow up outpatient basis/residential treatment program Is patient on multiple antipsychotic therapies at discharge:  No   Has Patient had three or more failed trials of antipsychotic monotherapy by history:  No  Recommended Plan for Multiple Antipsychotic Therapies: NA    Tiasha Helvie A 10/08/2013, 4:38 PM

## 2013-10-08 NOTE — Progress Notes (Signed)
Patient ID: [name] [name], male   DOB: [birth year], 38 y.o.   MRN: [id number] D: Patient denies SI/HI and auditory and visual hallucinations. Patient has a depressed mood and affect.  No signs or symptoms of withdrawal.  A: Patient given emotional support from RN. Patient given medications per MD orders.  Patient encouraged to come to staff with any questions or concerns.  R: Patient remains cooperative and appropriate. Will continue to monitor patient for safety.

## 2013-10-08 NOTE — Plan of Care (Signed)
[facility] Observation Crisis Plan  Reason for Crisis Plan:  Substance Abuse   Plan of Care:  Referral for Substance Abuse  Family Support:    Mother, father (both in poor health); pt's 40 y/o daughter also lives with him.  Current Living Environment:  Living Arrangements: Children (mostly alone daughter in college)  Insurance:  [facility] Account   Name Acct ID Class Status Primary Coverage   [name] [name], [name] [name] [id number] BEHAVIORAL HEALTH OBSERVATION Open [place]        Guarantor Account (for Hospital Account [id number])   Name Relation to Pt Service Area Active? Acct Type   Cavendish, [name] [name] Self [facility] Yes Behavioral Health   Address Phone       [address] [place], [place] [address] [phone](H)          Coverage Information (for Hospital Account [id number])   F/O Payor/Plan Precert #   [facility] [place]    Subscriber Subscriber #   [name] [name], [name] [name] [id number]   Address Phone   [address] [place], [place] [address] [phone]      Legal Guardian:   Self  Primary Care Provider:  No PCP Per Patient; Dr Jacqlyn Larsen  Current Outpatient Providers:  Floydene Flock in [place]  Psychiatrist:   [facility]  Counselor/Therapist:   [facility]  Compliant with Medications:  Yes  Additional Information: After consulting with Geoffery Lyons, MD and Claudette Head, NP it has been determined that pt does not present a life threatening danger to himself or others, and that psychiatric hospitalization is not indicated for him at this time.  However, he would benefit from admission to a residential substance abuse detoxification and rehabilitation facility.  Pt signed Consent to Release Information to [facility] and to [facility].  Pt was referred to [facility], and at 16:50 Dois Davenport called to inform us that pt has been accepted to their facility.  Pt was given written directions to the facility, and their location and phone number have been  included in pt's discharge instructions.  Pt's mother, who is currently waiting in the lobby at [facility], will transport pt.  Doylene Canning, MA Triage Specialist Kizzie Bane Harriette Bouillon 9/15/20154:57 PM

## 2013-10-08 NOTE — Progress Notes (Signed)
Patient ID: [name] [name], male   DOB: 1976/01/15, 38 y.o.   MRN: [id number] Patient discharged per MD orders. Patient given education regarding follow-up appointments and medications. Patient denies any questions or concerns about these instructions. Patient was escorted to locker and given belongings before discharge to hospital lobby. Patient currently denies SI/HI and auditory and visual hallucinations on discharge.

## 2013-10-17 NOTE — Discharge Summary (Signed)
Physician Discharge Summary Note  Patient:  [name] [name] is an 38 y.o., male MRN:  [id number] DOB:  05-23-1975 Patient phone:  [phone] (home)  Patient address:   [address] [place] [address],  Total Time spent with patient: 30 minutes  Date of Admission:  10/07/2013 Date of Discharge: 10/08/2013  Reason for Admission:  HPI: [name] [name] is an 38 y.o. male who presents seeking detox. He reports that he uses between half a gram and 2 grams of heroin daily. He reports feeling no control over his heroin use. He states that he wakes up and needs it immediately and if he didn't have money he would pawn something. He also abuses xanax, marijuana, and alcohol. He reports constant feelings of depression, which he felt he were under control at one point and then his medications were changed and he feels like he's just been struggling to find something else that helps. He reports that he's got multiple stressors including his father's death and his mother's failing health. His 97 year old daughter is living in the home. He denies SI, but endorses depression including feelings of worthlessness, irritability, anxiety, tearfulness, anhedonia, and fatigue. He states he sleeps around 3 hours per night and has lost approximately 20-30 lbs. He denies HI, history of violence, AVH or delusional thoughts. He is calm and cooperative and appropriate for treatment.  *Pt stayed in OBS Overnight without incident. Denies withdrawal symptoms, COWS, CIWA, both low scores under 5.  Axis I: Substance Abuse and Substance Induced Mood Disorder  Axis II: Deferred  Axis III:  Past Medical History   Diagnosis  Date   .  GERD (gastroesophageal reflux disease)    .  Arthritis      right hand   .  Anxiety    .  Fluctuating blood pressure      no current med.   .  Dental crowns present    .  Dental bridge present      lower   .  Abrasion of leg  06/29/2012     abrasions bilateral leg   .  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  Bipolar 1 disorder    .  Depression    Axis IV: other psychosocial or environmental problems and problems with primary support group  Axis V: 41-50 serious symptoms  Past Medical History:  Past Medical History   Diagnosis  Date   .  GERD (gastroesophageal reflux disease)    .  Arthritis      right hand   .  Anxiety    .  Fluctuating blood pressure      no current med.   .  Dental crowns present    .  Dental bridge present      lower   .  Abrasion of leg  06/29/2012     abrasions bilateral leg   .  Fracture of finger of right hand with nonunion  06/2012     long finger   .  Bipolar 1 disorder    .  Depression     Past Surgical History   Procedure  Laterality  Date   .  Orif ankle fracture  Right    .  I&d extremity   08/30/2011     Procedure: IRRIGATION AND DEBRIDEMENT EXTREMITY; Surgeon: Tami Ribas, MD; Location: [facility] OR; Service: Orthopedics; Laterality: Right;   .  Amputation  08/30/2011     Procedure: AMPUTATION DIGIT; Surgeon: Tami Ribas, MD; Location[facility] OR; Service: Orthopedics; Laterality: Right; partial   .  Artery repair   08/30/2011     Procedure: ARTERY REPAIR; Surgeon: Tami Ribas, MD; Location: [facility] OR; Service: Orthopedics; Laterality: Right;   .  Laceration repair   08/30/2011     Procedure: REPAIR MULTIPLE LACERATIONS; Surgeon: Tami Ribas, MD; Location: [facility] OR; Service: Orthopedics; Laterality: Right; multiple fingers crush injury   .  Open reduction internal fixation (orif) finger with radial bone graft  Right  07/03/2012     Procedure: OPEN REDUCTION INTERNAL FIXATION (ORIF) LONG FINGER WITH BONE GRAFT; Surgeon: Tami Ribas, MD; Location: [facility]; Service: Orthopedics; Laterality: Right;   .  Carpal tunnel release  Right  07/03/2012     Procedure: CARPAL TUNNEL RELEASE; Surgeon: Tami Ribas, MD; Location: [facility]; Service: Orthopedics; Laterality: Right;   Family History: History  reviewed. No pertinent family history.  Social History: reports that he has been smoking Cigarettes. He has a 10 pack-year smoking history. He has quit using smokeless tobacco. He reports that he drinks alcohol. He reports that he does not use illicit drugs.       Discharge Diagnoses: Active Problems:   Opiate dependence  Psychiatric Specialty Exam:      Blood pressure 126/88, pulse 70, temperature 98.8 F (37.1 C), temperature source Oral, resp. rate 18, height  (1.803 m), weight 78.472 kg (173 lb).Body mass index is 24.14 kg/(m^2).   General Appearance: Fairly Groomed   Patent attorney:: Fair   Speech: Clear and Coherent   Volume: Normal   Mood: Anxious   Affect: anxious   Thought Process: Coherent and Goal Directed   Orientation: Full (Time, Place, and Person)   Thought Content: sympomts, relapse prevention plan   Suicidal Thoughts: No   Homicidal Thoughts: No   Memory: Immediate; Fair  Recent; Fair  Remote; Fair   Judgement: Fair   Insight: Present   Psychomotor Activity: Normal   Concentration: Fair   Recall: [employer] of Knowledge:NA   Language: Fair   Akathisia: No   Handed:   AIMS (if indicated):   Assets: Desire for Improvement  Social Support   Sleep:   Musculoskeletal:  Strength & Muscle Tone: within normal limits  Gait & Station: normal  Patient leans: N/A  Mental Status Per Nursing Assessment::  On Admission: NA  Current Mental Status by Physician: In full contact with reality. There are no active S/S of withdrawal. No active SI, plans or intent. He states he knows he might still experience some withdrawal later on, but states he will be with his mother and that he will be able to handle it. Will seek out residential treatment options   Psychiatric Specialty Exam: See above  Physical Exam  ROS  Blood pressure 126/88, pulse 70, temperature 98.8 F (37.1 C), temperature source Oral, resp. rate 18, height  (1.803 m), weight 78.472 kg (173  lb).Body mass index is 24.14 kg/(m^2).  General Appearance:   Eye Contact::    Speech:    Volume:    Mood:    Affect:    Thought Process:    Orientation:    Thought Content:    Suicidal Thoughts:    Homicidal Thoughts:    Memory:    Judgement:    Insight:    Psychomotor Activity:    Concentration:    Recall:  Fund of Knowledge:  Language:   Akathisia:    Handed:    AIMS (if indicated):     Assets:    Sleep:      Musculoskeletal: Strength & Muscle Tone:  Gait & Station:  Patient leans:   DSM5:  Substance/Addictive Disorders:  Opioid Disorder - Severe (304.00), Benzodiazepine Abuse Depressive Disorders:  Major Depressive Disorder - Moderate (296.22)  Axis Diagnosis:   AXIS I:  Substance Induced Mood Disorder AXIS II:  No diagnosis AXIS III:   Past Medical History  Diagnosis Date  . GERD (gastroesophageal reflux disease)   . Arthritis     right hand  . Anxiety   . Fluctuating blood pressure     no current med.  . Dental crowns present   . Dental bridge present     lower  . Abrasion of leg 06/29/2012    abrasions bilateral leg  . 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 Bipolar 1 disorder   . Depression    AXIS IV:  other psychosocial or environmental problems AXIS V:  61-70 mild symptoms  Level of Care:  OP  Hospital Course:  He was admitted to the observation bed. He was started on a detox protocol. He felt he was ready to be D/C as he felt the worst of the withdrawal was over and what ever was left he could handle it. He was going to stay with his mother  Consults:  None  Significant Diagnostic Studies:  As per the ED  Discharge Vitals:   Blood pressure 126/88, pulse 70, temperature 98.8 F (37.1 C), temperature source Oral, resp. rate 18, height  (1.803 m), weight 78.472 kg (173 lb). Body mass index is 24.14 kg/(m^2). Lab Results:   No results found for this or any previous visit (from the past 72  hour(s)).  Physical Findings: AIMS: Facial and Oral Movements Muscles of Facial Expression: None, normal Lips and Perioral Area: None, normal Jaw: None, normal Tongue: None, normal,Extremity Movements Upper (arms, wrists, hands, fingers): None, normal Lower (legs, knees, ankles, toes): None, normal, Trunk Movements Neck, shoulders, hips: None, normal, Overall Severity Severity of abnormal movements (highest score from questions above): None, normal Incapacitation due to abnormal movements: None, normal Patient's awareness of abnormal movements (rate only patient's report): No Awareness, Dental Status Current problems with teeth and/or dentures?: No Does patient usually wear dentures?: No  CIWA:  CIWA-Ar Total: 3 COWS:  COWS Total Score: 3  Psychiatric Specialty Exam: See Psychiatric Specialty Exam and Suicide Risk Assessment completed by Attending Physician prior to discharge.  Discharge destination:  Home  Is patient on multiple antipsychotic therapies at discharge:  No   Has Patient had three or more failed trials of antipsychotic monotherapy by history:  No  Recommended Plan for Multiple Antipsychotic Therapies: NA     Medication List    STOP taking these medications       ALPRAZolam 1 MG tablet  Commonly known as:  XANAX      TAKE these medications     Indication   FLUoxetine 20 MG tablet  Commonly known as:  PROZAC  Take 60 mg by mouth daily.      gabapentin 300 MG capsule  Commonly known as:  NEURONTIN  Take 300 mg by mouth 4 (four) times daily.          Follow-up recommendations:  Activity:  as tolerated Diet:  regular Follow up outpatient basis/NA  Total Discharge Time:  Less than 30 minutes.  Signed: Margrett Kalb A 10/17/2013, 3:34 PM

## 2015-11-11 ENCOUNTER — Emergency Department (HOSPITAL_COMMUNITY)
Admission: EM | Admit: 2015-11-11 | Discharge: 2015-11-12 | Disposition: A | Discharge status: Other Acute Inpatient Hospital | Payer: Medicaid Other

## 2015-11-11 ENCOUNTER — Encounter (HOSPITAL_COMMUNITY): Payer: Self-pay | Admitting: Emergency Medicine

## 2015-11-11 DIAGNOSIS — T426X2A Poisoning by other antiepileptic and sedative-hypnotic drugs, intentional self-harm, initial encounter: Secondary | ICD-10-CM | POA: Insufficient documentation

## 2015-11-11 DIAGNOSIS — T1491XA Suicide attempt, initial encounter: Secondary | ICD-10-CM

## 2015-11-11 DIAGNOSIS — F17210 Nicotine dependence, cigarettes, uncomplicated: Secondary | ICD-10-CM | POA: Insufficient documentation

## 2015-11-11 DIAGNOSIS — R45851 Suicidal ideations: Secondary | ICD-10-CM

## 2015-11-11 DIAGNOSIS — T401X2A Poisoning by heroin, intentional self-harm, initial encounter: Secondary | ICD-10-CM | POA: Insufficient documentation

## 2015-11-11 DIAGNOSIS — Z79899 Other long term (current) drug therapy: Secondary | ICD-10-CM | POA: Insufficient documentation

## 2015-11-11 LAB — RAPID URINE DRUG SCREEN, HOSP PERFORMED
Amphetamines: NOT DETECTED
Barbiturates: NOT DETECTED
Benzodiazepines: POSITIVE — AB
Cocaine: NOT DETECTED
Opiates: POSITIVE — AB
Tetrahydrocannabinol: NOT DETECTED

## 2015-11-11 LAB — CBC
HCT: 43.4 % (ref 39.0–52.0)
Hemoglobin: 14.4 g/dL (ref 13.0–17.0)
MCH: 31.1 pg (ref 26.0–34.0)
MCHC: 33.2 g/dL (ref 30.0–36.0)
MCV: 93.7 fL (ref 78.0–100.0)
Platelets: 341 10*3/uL (ref 150–400)
RBC: 4.63 MIL/uL (ref 4.22–5.81)
RDW: 14.3 % (ref 11.5–15.5)
WBC: 14.9 10*3/uL — ABNORMAL HIGH (ref 4.0–10.5)

## 2015-11-11 LAB — ETHANOL

## 2015-11-11 LAB — COMPREHENSIVE METABOLIC PANEL
ALT: 17 U/L (ref 17–63)
AST: 17 U/L (ref 15–41)
Albumin: 3.9 g/dL (ref 3.5–5.0)
Alkaline Phosphatase: 56 U/L (ref 38–126)
Anion gap: 9 (ref 5–15)
BILIRUBIN TOTAL: 0.4 mg/dL (ref 0.3–1.2)
BUN: 15 mg/dL (ref 6–20)
CALCIUM: 9.5 mg/dL (ref 8.9–10.3)
CO2: 25 mmol/L (ref 22–32)
Chloride: 109 mmol/L (ref 101–111)
Creatinine, Ser: 1.16 mg/dL (ref 0.61–1.24)
GFR calc non Af Amer: >60 mL/min (ref >60)
Glucose, Bld: 121 mg/dL — ABNORMAL HIGH (ref 65–99)
Potassium: 3.7 mmol/L (ref 3.5–5.1)
Sodium: 143 mmol/L (ref 135–145)
TOTAL PROTEIN: 7.4 g/dL (ref 6.5–8.1)

## 2015-11-11 LAB — ACETAMINOPHEN LEVEL: Acetaminophen (Tylenol), Serum: <10 ug/mL — ABNORMAL LOW (ref 10–30)

## 2015-11-11 LAB — SALICYLATE LEVEL

## 2015-11-11 MED ORDER — LISINOPRIL 10 MG PO TABS
10.0000 mg | ORAL_TABLET | Freq: Every day | ORAL | Status: DC
  Administered 2015-11-12: 10 mg via ORAL
  Filled 2015-11-11: qty 1

## 2015-11-11 MED ORDER — IBUPROFEN 200 MG PO TABS: 600.0000 mg | ORAL_TABLET | Freq: Three times a day (TID) | ORAL | Status: DC | PRN

## 2015-11-11 MED ORDER — ACETAMINOPHEN 325 MG PO TABS
650.0000 mg | ORAL_TABLET | ORAL | Status: DC | PRN
  Filled 2015-11-11: qty 2

## 2015-11-11 MED ORDER — QUETIAPINE FUMARATE 200 MG PO TABS
200.0000 mg | ORAL_TABLET | Freq: Every day | ORAL | Status: DC
Start: 2015-11-11 — End: 2015-11-12
  Administered 2015-11-11: 200 mg via ORAL
  Filled 2015-11-11: qty 1

## 2015-11-11 MED ORDER — ONDANSETRON HCL 4 MG PO TABS: 4.0000 mg | ORAL_TABLET | Freq: Three times a day (TID) | ORAL | Status: DC | PRN

## 2015-11-11 MED ORDER — FLUOXETINE HCL 20 MG PO CAPS
60.0000 mg | ORAL_CAPSULE | Freq: Every day | ORAL | Status: DC
  Administered 2015-11-12: 60 mg via ORAL
  Filled 2015-11-11: qty 3

## 2015-11-11 NOTE — ED Notes (Signed)
Contacted Patty with [facility]. Recommended observation for 6 hours post ingestion with cardiac monitoring. Fluids were recommended due to heart rate. Only concern with Poison Control is sedation and ataxia. If narcan is administered for the sedation and ataxia from the heroin, then an additional 4 hours of observation on cardiac monitor is recommended.  #[phone]

## 2015-11-11 NOTE — ED Notes (Signed)
[facility]. updated her on vitals, labs and pt condition. Sts that she was going to go ahead and close his case out, but to continue monitoring pt.

## 2015-11-11 NOTE — ED Provider Notes (Signed)
[facility]-EMERGENCY DEPT Provider Note   CSN: [id number] Arrival date & time: 11/11/15  1607     History   Chief Complaint Chief Complaint  Patient presents with  . Suicidal    HPI [name] [name] is a 40 y.o. male.  Patient presents after intentional ingestion of gabapentin and injection of heroin in suicide attempt. States he injected himself with heroin to several handfuls of neurontin around 1 PM. He estimates he ingested 8 Thousand milligrams of gabapentin. Denies any alcohol ingestion. Denies any other medication ingestion. He states he was trying to hurt himself. He denies trying to hurt himself in the past. He recently underwent dental extractions and has been taking Vicodin with has not had any for several days. Denies any chest pain, bowel pain, nausea or vomiting.   The history is provided by the patient.    Past Medical History:  Diagnosis Date  . Abrasion of leg 06/29/2012   abrasions bilateral leg  . Anxiety   . Arthritis    right hand  . Bipolar 1 disorder (HCC)   . Dental bridge present    lower  . Dental crowns present   . Depression   . Fluctuating blood pressure    no current med.  . 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 GERD (gastroesophageal reflux disease)     Patient Active Problem List   Diagnosis Date Noted  . Opiate dependence (HCC) 10/07/2013    Past Surgical History:  Procedure Laterality Date  . AMPUTATION  08/30/2011   Procedure: AMPUTATION DIGIT;  Surgeon: Tami Ribas, MD;  Location: [facility] OR;  Service: Orthopedics;  Laterality: Right;  partial  . ARTERY REPAIR  08/30/2011   Procedure: ARTERY REPAIR;  Surgeon: Tami Ribas, MD;  Location: [facility] OR;  Service: Orthopedics;  Laterality: Right;  . CARPAL TUNNEL RELEASE Right 07/03/2012   Procedure: CARPAL TUNNEL RELEASE;  Surgeon: Tami Ribas, MD;  Location: [facility];  Service: Orthopedics;  Laterality: Right;  . I&D EXTREMITY  08/30/2011   Procedure:  IRRIGATION AND DEBRIDEMENT EXTREMITY;  Surgeon: Tami Ribas, MD;  Location: [facility] OR;  Service: Orthopedics;  Laterality: Right;  . LACERATION REPAIR  08/30/2011   Procedure: REPAIR MULTIPLE LACERATIONS;  Surgeon: Tami Ribas, MD;  Location: [facility] OR;  Service: Orthopedics;  Laterality: Right;  multiple fingers crush injury  . OPEN REDUCTION INTERNAL FIXATION (ORIF) FINGER WITH RADIAL BONE GRAFT Right 07/03/2012   Procedure: OPEN REDUCTION INTERNAL FIXATION (ORIF) LONG FINGER WITH BONE GRAFT;  Surgeon: Tami Ribas, MD;  Location: [facility];  Service: Orthopedics;  Laterality: Right;  . ORIF ANKLE FRACTURE Right        Home Medications    Prior to Admission medications   Medication Sig Start Date End Date Taking? Authorizing Provider  FLUoxetine (PROZAC) 20 MG tablet Take 60 mg by mouth daily.   Yes Historical Provider, MD  gabapentin (NEURONTIN) 400 MG capsule Take 400 mg by mouth 3 (three) times daily. 11/09/15  Yes Historical Provider, MD  ibuprofen (ADVIL,MOTRIN) 200 MG tablet Take 400 mg by mouth every 6 (six) hours as needed.   Yes Historical Provider, MD  lisinopril (PRINIVIL,ZESTRIL) 10 MG tablet Take 10 mg by mouth daily. 10/23/15 10/22/16 Yes Historical Provider, MD  QUEtiapine (SEROQUEL) 200 MG tablet Take 200 mg by mouth at bedtime. 10/23/15 11/22/15 Yes Historical Provider, MD    Family History No family history on file.  Social History Social History  Substance Use Topics  . Smoking status: Current Every Day Smoker    Packs/day: 0.50    Years: 20.00    Types: Cigarettes  . Smokeless tobacco: Former Neurosurgeon  . Alcohol use 7.2 oz/week    12 Cans of beer per week     Allergies   Review of patient's allergies indicates no known allergies.   Review of Systems Review of Systems  Constitutional: Negative.  Negative for activity change, appetite change and fever.  HENT: Negative for congestion and rhinorrhea.   Respiratory: Negative for cough, chest tightness  and shortness of breath.   Cardiovascular: Negative for chest pain.  Gastrointestinal: Negative for abdominal pain, nausea and vomiting.  Genitourinary: Negative for dysuria and hematuria.  Musculoskeletal: Negative for arthralgias.  Skin: Negative for color change.  Neurological: Negative for dizziness, light-headedness and numbness.  Psychiatric/Behavioral: Positive for self-injury and suicidal ideas. Negative for behavioral problems and decreased concentration. The patient is nervous/anxious.    A complete 10 system review of systems was obtained and all systems are negative except as noted in the HPI and PMH.    Physical Exam Updated Vital Signs BP 128/75   Pulse 74   Temp 98.4 F (36.9 C) (Oral)   Resp 14   Ht 5\' 10"  (1.778 m)   Wt 180 lb (81.6 kg)   SpO2 97%   BMI 25.83 kg/m   Physical Exam  Constitutional: He is oriented to person, place, and time. He appears well-developed and well-nourished. No distress.  HENT:  Head: Normocephalic and atraumatic.  Mouth/Throat: Oropharynx is clear and moist. No oropharyngeal exudate.  Eyes: Conjunctivae and EOM are normal. Pupils are equal, round, and reactive to light.  Neck: Normal range of motion. Neck supple.  No meningismus.  Cardiovascular: Normal rate, regular rhythm, normal heart sounds and intact distal pulses.   No murmur heard. Pulmonary/Chest: Effort normal and breath sounds normal. No respiratory distress.  Abdominal: Soft. There is no tenderness. There is no rebound and no guarding.  Musculoskeletal: Normal range of motion. He exhibits no edema or tenderness.  Neurological: He is alert and oriented to person, place, and time. No cranial nerve deficit. He exhibits normal muscle tone. Coordination normal.  No ataxia on finger to nose bilaterally. No pronator drift. 5/5 strength throughout. CN 2-12 intact.Equal grip strength. Sensation intact.   Skin: Skin is warm.  Psychiatric: He has a normal mood and affect. His  behavior is normal.  Nursing note and vitals reviewed.    ED Treatments / Results  Labs (all labs ordered are listed, but only abnormal results are displayed) Labs Reviewed  COMPREHENSIVE METABOLIC PANEL - Abnormal; Notable for the following:       Result Value   Glucose, Bld 121 (*)    All other components within normal limits  ACETAMINOPHEN LEVEL - Abnormal; Notable for the following:    Acetaminophen (Tylenol), Serum <10 (*)    All other components within normal limits  CBC - Abnormal; Notable for the following:    WBC 14.9 (*)    All other components within normal limits  RAPID URINE DRUG SCREEN, HOSP PERFORMED - Abnormal; Notable for the following:    Opiates POSITIVE (*)    Benzodiazepines POSITIVE (*)    All other components within normal limits  ACETAMINOPHEN LEVEL - Abnormal; Notable for the following:    Acetaminophen (Tylenol), Serum <10 (*)    All other components within normal limits  ETHANOL  SALICYLATE LEVEL  CBG MONITORING, ED    EKG  EKG Interpretation None       Radiology No results found.  Procedures Procedures (including critical care time)  Medications Ordered in ED Medications - No data to display   Initial Impression / Assessment and Plan / ED Course  I have reviewed the triage vital signs and the nursing notes.  Pertinent labs & imaging results that were available during my care of the patient were reviewed by me and considered in my medical decision making (see chart for details).  Clinical Course  patient with intentional ingestion of gabapentin and injection of heroin in suicide attempt. At this point it has been >8 hours since attempt.  Patient is awake and alert and not symptomatic.  D/w poison center. Patient can be medically cleared for psychiatric evaluation at this point. 4 hour tylenol level negative.  Labs unremarkable. Patient medically clear from his ingestion. Holding orders placed for psychiatry evaluation.  Final  Clinical Impressions(s) / ED Diagnoses   Final diagnoses:  Suicidal ideation  Suicide attempt    New Prescriptions New Prescriptions   No medications on file     Glynn OctaveStephen Judianne Seiple, MD 11/11/15 2357

## 2015-11-11 NOTE — ED Triage Notes (Addendum)
Pt states he is a heroin addict, ate 8000 mg of Neurontin 2-3 hours ago, took a big shot of heroin (0.5 g), and was trying to kill himself. Pt states he got into a rehab facility but needs to detox before he can get in there.

## 2015-11-12 ENCOUNTER — Observation Stay (HOSPITAL_COMMUNITY)
Admission: AD | Admit: 2015-11-12 | Discharge: 2015-11-13 | Disposition: A | Discharge status: Home / Self Care | Payer: Medicaid Other | Source: Intra-hospital | Attending: Psychiatry | Admitting: Psychiatry

## 2015-11-12 ENCOUNTER — Encounter (HOSPITAL_COMMUNITY): Payer: Self-pay | Admitting: *Deleted

## 2015-11-12 DIAGNOSIS — Z79899 Other long term (current) drug therapy: Secondary | ICD-10-CM | POA: Insufficient documentation

## 2015-11-12 DIAGNOSIS — F1120 Opioid dependence, uncomplicated: Principal | ICD-10-CM | POA: Insufficient documentation

## 2015-11-12 DIAGNOSIS — F319 Bipolar disorder, unspecified: Secondary | ICD-10-CM | POA: Insufficient documentation

## 2015-11-12 DIAGNOSIS — R45851 Suicidal ideations: Secondary | ICD-10-CM | POA: Insufficient documentation

## 2015-11-12 MED ORDER — ACETAMINOPHEN 325 MG PO TABS
650.0000 mg | ORAL_TABLET | ORAL | Status: DC | PRN
Start: 2015-11-12 — End: 2015-11-13
  Administered 2015-11-13: 650 mg via ORAL
  Filled 2015-11-12: qty 2

## 2015-11-12 MED ORDER — LISINOPRIL 10 MG PO TABS
10.0000 mg | ORAL_TABLET | Freq: Every day | ORAL | Status: DC
  Administered 2015-11-13: 10 mg via ORAL
  Filled 2015-11-12: qty 1

## 2015-11-12 MED ORDER — GABAPENTIN 400 MG PO CAPS
400.0000 mg | ORAL_CAPSULE | Freq: Three times a day (TID) | ORAL | Status: DC
  Administered 2015-11-12 – 2015-11-13 (×2): 400 mg via ORAL
  Filled 2015-11-12 (×2): qty 1

## 2015-11-12 MED ORDER — ONDANSETRON 4 MG PO TBDP: 4.0000 mg | ORAL_TABLET | Freq: Four times a day (QID) | ORAL | Status: DC | PRN

## 2015-11-12 MED ORDER — LOPERAMIDE HCL 2 MG PO CAPS: 2.0000 mg | ORAL_CAPSULE | ORAL | Status: DC | PRN

## 2015-11-12 MED ORDER — QUETIAPINE FUMARATE ER 200 MG PO TB24
ORAL_TABLET | ORAL | Status: AC
  Filled 2015-11-12: qty 1

## 2015-11-12 MED ORDER — FLUOXETINE HCL 20 MG PO CAPS
60.0000 mg | ORAL_CAPSULE | Freq: Every day | ORAL | Status: DC
  Administered 2015-11-13: 60 mg via ORAL
  Filled 2015-11-12: qty 3

## 2015-11-12 MED ORDER — ONDANSETRON HCL 4 MG PO TABS: 4.0000 mg | ORAL_TABLET | Freq: Three times a day (TID) | ORAL | Status: DC | PRN

## 2015-11-12 MED ORDER — FLUOXETINE HCL 20 MG PO CAPS: 60.0000 mg | ORAL_CAPSULE | Freq: Every day | ORAL | Status: DC

## 2015-11-12 MED ORDER — IBUPROFEN 600 MG PO TABS: 600.0000 mg | ORAL_TABLET | Freq: Three times a day (TID) | ORAL | Status: DC | PRN

## 2015-11-12 MED ORDER — TRAZODONE HCL 50 MG PO TABS
50.0000 mg | ORAL_TABLET | Freq: Every evening | ORAL | Status: DC | PRN
  Administered 2015-11-12: 50 mg via ORAL
  Filled 2015-11-12: qty 1

## 2015-11-12 MED ORDER — CHLORDIAZEPOXIDE HCL 25 MG PO CAPS
25.0000 mg | ORAL_CAPSULE | Freq: Four times a day (QID) | ORAL | Status: DC | PRN
  Administered 2015-11-12 – 2015-11-13 (×3): 25 mg via ORAL
  Filled 2015-11-12 (×3): qty 1

## 2015-11-12 MED ORDER — VITAMIN B-1 100 MG PO TABS
100.0000 mg | ORAL_TABLET | Freq: Every day | ORAL | Status: DC
  Administered 2015-11-13: 100 mg via ORAL
  Filled 2015-11-12: qty 1

## 2015-11-12 MED ORDER — QUETIAPINE FUMARATE 200 MG PO TABS
200.0000 mg | ORAL_TABLET | Freq: Every day | ORAL | Status: DC
  Administered 2015-11-12: 200 mg via ORAL
  Filled 2015-11-12: qty 1

## 2015-11-12 MED ORDER — ADULT MULTIVITAMIN W/MINERALS CH
1.0000 | ORAL_TABLET | Freq: Every day | ORAL | Status: DC
  Administered 2015-11-12 – 2015-11-13 (×2): 1 via ORAL
  Filled 2015-11-12 (×2): qty 1

## 2015-11-12 MED ORDER — THIAMINE HCL 100 MG/ML IJ SOLN
100.0000 mg | Freq: Once | INTRAMUSCULAR | Status: AC
  Administered 2015-11-12: 100 mg via INTRAMUSCULAR
  Filled 2015-11-12: qty 2

## 2015-11-12 MED ORDER — NICOTINE 21 MG/24HR TD PT24: 21.0000 mg | MEDICATED_PATCH | Freq: Every day | TRANSDERMAL | Status: DC

## 2015-11-12 MED ORDER — NICOTINE 21 MG/24HR TD PT24
21.0000 mg | MEDICATED_PATCH | Freq: Every day | TRANSDERMAL | Status: DC
  Administered 2015-11-12 – 2015-11-13 (×2): 21 mg via TRANSDERMAL
  Filled 2015-11-12 (×2): qty 1

## 2015-11-12 MED ORDER — QUETIAPINE FUMARATE ER 200 MG PO TB24: 200.0000 mg | ORAL_TABLET | Freq: Every day | ORAL | Status: DC

## 2015-11-12 MED ORDER — HYDROXYZINE HCL 25 MG PO TABS: 25.0000 mg | ORAL_TABLET | Freq: Four times a day (QID) | ORAL | Status: DC | PRN

## 2015-11-12 MED ORDER — LISINOPRIL 10 MG PO TABS
10.0000 mg | ORAL_TABLET | Freq: Two times a day (BID) | ORAL | Status: DC
  Administered 2015-11-12: 10 mg via ORAL
  Filled 2015-11-12: qty 1

## 2015-11-12 NOTE — ED Notes (Signed)
Attempted to call report. REquested to call back in 5-10 minutes.

## 2015-11-12 NOTE — Progress Notes (Signed)
Admission Note: Pt is a 40 y/o WM who [facility] and etoh detox. Pt presents anxious with pressured speech on initial contact. Ambulatory to Observation unit with a steady gait. A & O X4. Denies SI, HI, AVH and pain when assessed, but reported "not feeling safe about being out there right now, I need help getting into a long term treatment program". Per nursing report pt attempted suicide by overdosing on heroin (0.5g) and 8,000 mg of Neurontin pills. 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 Pt does have a h/o Bipolar d/o. Cooperative with care thus far. Skin assessment done and belongings searched per protocol. Multiple scabs noted on pt's left arm and bilateral legs, scratch on left upper back. Pt's belonging secured in locker 40. Unit orientation and routines discussed with pt, understanding verbalized. Emotional support and availability provided to pt. Lunch and fluids offered, tolerated well.  Pt remains safe in Observation unit without self harm gestures to note at this time. Will continue to monitor pt for safety and mood stabilization.

## 2015-11-12 NOTE — ED Notes (Signed)
Nurse woke pt to inform him up assessment to take place within 15 minutes. Medications taken to pharmacy.

## 2015-11-12 NOTE — ED Provider Notes (Addendum)
Sleeping, easily arousable, appears comfortable. Denies complaint   Doug SouSam Shaelynn Dragos, MD 11/12/15 0857 10:10 AM patient is alert Glasgow Coma Score 15 ambulates without difficulty, pleasant and cooperative. Accepted by Dr.Kumar [facility]. Stable for transfer Results for orders placed or performed during the hospital encounter of 11/11/15  Comprehensive metabolic panel  Result Value Ref Range   Sodium 143 135 - 145 mmol/L   Potassium 3.7 3.5 - 5.1 mmol/L   Chloride 109 101 - 111 mmol/L   CO2 25 22 - 32 mmol/L   Glucose, Bld 121 (H) 65 - 99 mg/dL   BUN 15 6 - 20 mg/dL   Creatinine, Ser 1.611.16 0.61 - 1.24 mg/dL   Calcium 9.5 8.9 - 09.610.3 mg/dL   Total Protein 7.4 6.5 - 8.1 g/dL   Albumin 3.9 3.5 - 5.0 g/dL   AST 17 15 - 41 U/L   ALT 17 17 - 63 U/L   Alkaline Phosphatase 56 38 - 126 U/L   Total Bilirubin 0.4 0.3 - 1.2 mg/dL   GFR calc non Af Amer >60 >60 mL/min   GFR calc Af Amer >60 >60 mL/min   Anion gap 9 5 - 15  Ethanol  Result Value Ref Range   Alcohol, Ethyl (B) <5 <5 mg/dL  Salicylate level  Result Value Ref Range   Salicylate Lvl <7.0 2.8 - 30.0 mg/dL  Acetaminophen level  Result Value Ref Range   Acetaminophen (Tylenol), Serum <10 (L) 10 - 30 ug/mL  cbc  Result Value Ref Range   WBC 14.9 (H) 4.0 - 10.5 K/uL   RBC 4.63 4.22 - 5.81 MIL/uL   Hemoglobin 14.4 13.0 - 17.0 g/dL   HCT 04.543.4 40.939.0 - 81.152.0 %   MCV 93.7 78.0 - 100.0 fL   MCH 31.1 26.0 - 34.0 pg   MCHC 33.2 30.0 - 36.0 g/dL   RDW 91.414.3 78.211.5 - 95.615.5 %   Platelets 341 150 - 400 K/uL  Rapid urine drug screen (hospital performed)  Result Value Ref Range   Opiates POSITIVE (A) NONE DETECTED   Cocaine NONE DETECTED NONE DETECTED   Benzodiazepines POSITIVE (A) NONE DETECTED   Amphetamines NONE DETECTED NONE DETECTED   Tetrahydrocannabinol NONE DETECTED NONE DETECTED   Barbiturates NONE DETECTED NONE DETECTED  Acetaminophen level  Result Value Ref Range   Acetaminophen (Tylenol), Serum <10 (L) 10 - 30 ug/mL    No results found.   Doug SouSam Dreonna Hussein, MD 11/12/15 1110

## 2015-11-12 NOTE — Progress Notes (Signed)
Patient stated that he has been accepted into [facility] [place]. He plans to be there for recovery for 12 to 18 months. Patient also stated that he has a ride to get to this facility upon discharge.

## 2015-11-12 NOTE — Progress Notes (Signed)
D: Patient awake watching TV at the time of shift change. Complained of stomach cramps and mild anxiety. Appear anxious but pleasant upon approach. Accepted his due/prn meds. No behavioral issues noted.  A: Staff offered support and encouragement to patient as needed. safety maintained by constant observation except when patient is in the bathroom. Will continue to monitor patient for safety and stability.  R: Patient remains safe.

## 2015-11-12 NOTE — Progress Notes (Signed)
Reviewed pt's case and discussed with psych team. Dr. Lucianne MussKumar has accepted pt [facility] for [facility] monitoring and evaluation. Assigned Obs bed 3, call report to [phone].   Ilean SkillMeghan Jasn Xia, MSW, LCSW Clinical Social Work, Disposition  11/12/2015 [phone]

## 2015-11-12 NOTE — BH Assessment (Addendum)
Tele Assessment Note   [name] [name] is an 40 y.o. male, who [facility]. Pt reported, he attempted suicide, by injecting himself with heroin and taking pills. Pt reported yesterday he had suicidal thoughts however he does not currently. Pt reported, his father is dying from cancer which is what triggered the suicide attempt. Pt denies, HI, AVH and self-injurious behaviors. Pt reported experiencing the following depressive symptoms: sadness/low mood, difficulty concentrating (pt reports at times), deceased sleep (pt reported if she does not take his sleep medication he will not sleep), isolating, and feeling hopeless.   Per Lorin PicketScott, RN note: "Pt states he is a heroin addict, ate 8000 mg of Neurontin 2-3 hours ago, took a big shot of heroin (0.5 g), and was trying to kill himself. Pt states he got into a rehab facility but needs to detox before he can get in there." Clinician spoke to [place] she reported she counted the pt's pills, he has too many pills left to have ingested the amount he reported (8,000 mg of Neurontin and Gabapentin).    Pt denied verbal, physical and sexual abuse. Pt reported he is set to [place] recovery [place] once he is detoxed. Pt reported he injected himself with a half a gram of heroin and "too many pills." Pt reported experiencing the following detox symptoms: stomach pain, leg cramps, and drowsiness. Pt denied he was linked to a psychiatrist or counselor.  Pt present sleepy/drowsy in scrubs with slurred speech. Clinician had to wake the pt a few times to continue to the assessment. Pt's eye contact was poor. Pt had his arm over his face for most of the assessment. Pt's thought process was relevant. Pt's judgement was impaired. Pt's mood was depressed. Pt's affect was appropriate to circumstance. Pt's insight and impulse control were poor. Pt reported: "I don't want to be discharged, I need at least 2-3 days to detox so I can go to  the recovery center on [address] [place] Pt reported if inpatient is recommend he will sign in voluntarily.   Diagnosis: Bipolar 1 Disorder (HCC)  Past Medical History:  Past Medical History:  Diagnosis Date  . Abrasion of leg 06/29/2012   abrasions bilateral leg  . Anxiety   . Arthritis    right hand  . Bipolar 1 disorder (HCC)   . Dental bridge present    lower  . Dental crowns present   . Depression   . Fluctuating blood pressure    no current med.  . 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 GERD (gastroesophageal reflux disease)     Past Surgical History:  Procedure Laterality Date  . AMPUTATION  08/30/2011   Procedure: AMPUTATION DIGIT;  Surgeon: Tami RibasKevin R Kuzma, MD;  [facility];  Service: Orthopedics;  Laterality: Right;  partial  . ARTERY REPAIR  08/30/2011   Procedure: ARTERY REPAIR;  Surgeon: Tami RibasKevin R Kuzma, MD;  Location: [facility] OR;  Service: Orthopedics;  Laterality: Right;  . CARPAL TUNNEL RELEASE Right 07/03/2012   Procedure: CARPAL TUNNEL RELEASE;  Surgeon: Tami RibasKevin R Kuzma, MD;  Location:  SURGERY CENTER;  Service: Orthopedics;  Laterality: Right;  . I&D EXTREMITY  08/30/2011   Procedure: IRRIGATION AND DEBRIDEMENT EXTREMITY;  Surgeon: Tami RibasKevin R Kuzma, MD;  Location: [facility] OR;  Service: Orthopedics;  Laterality: Right;  . LACERATION REPAIR  08/30/2011   Procedure: REPAIR MULTIPLE LACERATIONS;  Surgeon: Tami Ribas, MD;  Location: [facility] OR;  Service: Orthopedics;  Laterality: Right;  multiple fingers crush injury  . OPEN REDUCTION INTERNAL FIXATION (ORIF) FINGER WITH RADIAL BONE GRAFT Right 07/03/2012   Procedure: OPEN REDUCTION INTERNAL FIXATION (ORIF) LONG FINGER WITH BONE GRAFT;  Surgeon: Tami Ribas, MD;  Location: [facility];  Service: Orthopedics;  Laterality: Right;  . ORIF ANKLE FRACTURE Right     Family History: No family history on file.  Social History:  reports that he has been smoking Cigarettes.  He has  a 10.00 pack-year smoking history. He has quit using smokeless tobacco. He reports that he drinks about 7.2 oz of alcohol per week . He reports that he uses drugs, including IV, about 7 times per week.  Additional Social History:  Alcohol / Drug Use Pain Medications: Pt denies. Prescriptions: Pt denies. Over the Counter: Pt denies.  History of alcohol / drug use?: Yes Substance #1 Name of Substance 1: Heroin 1 - Age of First Use: UTA 1 - Amount (size/oz): Pt reported injecting a half of gram.  1 - Frequency: UTA 1 - Duration: UTA 1 - Last Use / Amount: UTA  CIWA: CIWA-Ar BP: 142/86 Pulse Rate: 75 COWS: Clinical Opiate Withdrawal Scale (COWS) Resting Pulse Rate: Pulse Rate 80 or below Sweating: No report of chills or flushing Restlessness: Able to sit still Pupil Size: Pupils pinned or normal size for room light Bone or Joint Aches: Not present Runny Nose or Tearing: Not present GI Upset: No GI symptoms Tremor: Tremor can be felt, but not observed Yawning: No yawning Anxiety or Irritability: None Gooseflesh Skin: Skin is smooth COWS Total Score: 1  PATIENT STRENGTHS: (choose at least two) Average or above average intelligence Supportive family/friends  Allergies: No Known Allergies  Home Medications:  (Not in a hospital admission)  OB/GYN Status:  No LMP for male patient.  General Assessment Data Location of Assessment: [facility] ED TTS Assessment: In system Is this a Tele or Face-to-Face Assessment?: Tele Assessment Is this an Initial Assessment or a Re-assessment for this encounter?: Initial Assessment Marital status: Divorced Long name: NA Is patient pregnant?: No Pregnancy Status: No Living Arrangements: Alone Can pt return to current living arrangement?: Yes Admission Status: Voluntary Is patient capable of signing voluntary admission?: Yes Referral Source: Self/Family/Friend Insurance type: Medicaid     Crisis Care Plan Living Arrangements: Alone Legal  Guardian: Other: (Self) Name of Psychiatrist: NA Name of Therapist: NA  Education Status Is patient currently in school?: No Current Grade: NA Highest grade of school patient has completed: Pt reported High school.  Name of school: NA Contact person: NA  Risk to self with the past 6 months Suicidal Ideation:  (Pt reported he has SI today. ) Has patient been a risk to self within the past 6 months prior to admission? : Yes Suicidal Intent: No-Not Currently/Within Last 6 Months Has patient had any suicidal intent within the past 6 months prior to admission? : No Is patient at risk for suicide?: Yes Suicidal Plan?: Yes-Currently Present Has patient had any suicidal plan within the past 6 months prior to admission? : Yes Specify Current Suicidal Plan: Pt tried to overdose. Access to Means: Yes Specify Access to Suicidal Means: Pt has access to heroin and his medication.  What has been your use of drugs/alcohol within the last 12 months?: Pt injected a half of gram of heroin yesterday.  Previous Attempts/Gestures: No How many times?: 0 Other Self Harm  Risks: Pt denies.  Triggers for Past Attempts: None known Intentional Self Injurious Behavior: None Family Suicide History: No Recent stressful life event(s): Other (Comment) (Pt reported his father is dying of cancer. ) Persecutory voices/beliefs?: No Depression: Yes Depression Symptoms: Feeling worthless/self pity, Isolating Substance abuse history and/or treatment for substance abuse?: Yes Suicide prevention information given to non-admitted patients: Not applicable  Risk to Others within the past 6 months Homicidal Ideation: No Does patient have any lifetime risk of violence toward others beyond the six months prior to admission? : No Thoughts of Harm to Others: No Current Homicidal Intent: No Current Homicidal Plan: No Access to Homicidal Means: No Identified Victim:  (NA) History of harm to others?: No Assessment of  Violence: None Noted Violent Behavior Description: NA Does patient have access to weapons?: No (Pt denies. ) Criminal Charges Pending?: No Does patient have a court date: No Is patient on probation?: No  Psychosis Hallucinations: None noted Delusions: None noted  Mental Status Report Appearance/Hygiene: In scrubs Eye Contact: Poor Motor Activity: Unremarkable Speech: Slurred Level of Consciousness: Drowsy, Sleeping Mood: Depressed Affect: Appropriate to circumstance Anxiety Level: None Thought Processes: Relevant Judgement: Impaired Orientation: Other (Comment) (year and state) Obsessive Compulsive Thoughts/Behaviors: Unable to Assess  Cognitive Functioning Concentration: Fair Memory: Recent Intact IQ: Average Insight: Fair Impulse Control: Poor Appetite: Good Weight Loss: 0 Weight Gain: 0 Sleep: Decreased Total Hours of Sleep: 6 Vegetative Symptoms: None  ADLScreening [facility] Assessment Services) Patient's cognitive ability adequate to safely complete daily activities?: Yes Patient able to express need for assistance with ADLs?: Yes Independently performs ADLs?: Yes (appropriate for developmental age)  Prior Inpatient Therapy Prior Inpatient Therapy:  (UTA) Prior Therapy Dates: UTA Prior Therapy Facilty/Provider(s): UTA Reason for Treatment: UTA  Prior Outpatient Therapy Prior Outpatient Therapy: No Prior Therapy Dates: NA Prior Therapy Facilty/Provider(s): NA Reason for Treatment: NA Does patient have an ACCT team?: No Does patient have Intensive In-House Services?  : No Does patient have [facility] services? : No Does patient have P4CC services?: No  ADL Screening (condition at time of admission) Patient's cognitive ability adequate to safely complete daily activities?: Yes Is the patient deaf or have difficulty hearing?: No Does the patient have difficulty seeing, even when wearing glasses/contacts?: No Does the patient have difficulty concentrating,  remembering, or making decisions?: No (Pt denies. ) Patient able to express need for assistance with ADLs?: Yes Does the patient have difficulty dressing or bathing?: No Independently performs ADLs?: Yes (appropriate for developmental age) Does the patient have difficulty walking or climbing stairs?: No Weakness of Legs: None Weakness of Arms/Hands: None       Abuse/Neglect Assessment (Assessment to be complete while patient is alone) Physical Abuse: Denies (Pt denies. ) Verbal Abuse: Denies (Pt denies. ) Sexual Abuse: Denies (Pt denies. ) Exploitation of patient/patient's resources: Denies (Pt denies. )     Advance Directives (For Healthcare) Does patient have an advance directive?: No Would patient like information on creating an advanced directive?: No - patient declined information    Additional Information 1:1 In Past 12 Months?: No CIRT Risk: No Elopement Risk: No Does patient have medical clearance?: No     Disposition: [name] Sievert, PA recommends AM Psychiatric Evaluation. Disposition was discussed with Alona Bene, RN.   Disposition Initial Assessment Completed for this Encounter: Yes Disposition of Patient: Other dispositions Other disposition(s): Other (Comment)  Gwinda Passe 11/12/2015 2:04 AM   Gwinda Passe, MS, [facility] Triage Specialist [phone]

## 2015-11-12 NOTE — ED Notes (Signed)
Called received from TTS. Pt to have psych eval in the AM.

## 2015-11-12 NOTE — ED Notes (Signed)
TTS in process 

## 2015-11-12 NOTE — H&P (Signed)
[facility]ation Unit Provider Admission PAA/H&P  Patient Identification: [name] [name] MRN:  [id number] Date of Evaluation:  11/12/2015 Chief Complaint:  BIPOLAR 1 DISORDER  Principal Diagnosis: Opiate dependence ([place]) Diagnosis:   Patient Active Problem List   Diagnosis Date Noted  . Opiate dependence ([place]) [F11.20] 10/07/2013    Priority: High   History of Present Illness:  Per Tele Assessment Note in chart 11/12/2015 written by [name] [name], Counselor:  [name] [name] is an 40 y.o. male, who presents voluntarily and unaccompanied to [facility]. Pt reported, he attempted suicide, by injecting himself with heroin and taking pills. Pt reported yesterday he had suicidal thoughts however he does not currently. Pt reported, his father is dying from cancer which is what triggered the suicide attempt. Pt denies, HI, AVH and self-injurious behaviors. Pt reported experiencing the following depressive symptoms: sadness/low mood, difficulty concentrating (pt reports at times), deceased sleep (pt reported if she does not take his sleep medication he will not sleep), isolating, and feeling hopeless.   Per [name] Reaper, RN note: "Pt states he is a heroin addict, ate 8000 mg of Neurontin 2-3 hours ago, took a big shot of heroin (0.5 g), and was trying to kill himself. Pt states he got into a rehab facility but needs to detox before he can get in there." Clinician spoke to [name] [name], [place] she reported she counted the pt's pills, he has too many pills left to have ingested the amount he reported (8,000 mg of Neurontin and Gabapentin).    Pt denied verbal, physical and sexual abuse. Pt reported he is set to go to a recovery center in [facility], [place] once he is detoxed. Pt reported he injected himself with a half a gram of heroin and "too many pills." Pt reported experiencing the following detox symptoms: stomach pain, leg cramps, and drowsiness. Pt denied he was linked to a psychiatrist or counselor.  Pt present sleepy/drowsy in  scrubs with slurred speech. Clinician had to wake the pt a few times to continue to the assessment. Pt's eye contact was poor. Pt had his arm over his face for most of the assessment. Pt's thought process was relevant. Pt's judgement was impaired. Pt's mood was depressed. Pt's affect was appropriate to circumstance. Pt's insight and impulse control were poor. Pt reported: "I don't want to be discharged, I need at least 2-3 days to detox so I can go to the recovery center on [address] in [facility]." Pt reported if inpatient is recommend he will sign in voluntarily.   Today:  In Obs today for detox. Pt is calm, cooperative, alert and oriented x 4, denies suicidal/homicidal ideations, denies auditory/visual hallucinations. Pt does not appear to be responding to internal stimuli. Pt states that he just needs to detox so he can return to [facility] for long term treatment for his addiction. Pt attempted suicide yesterday by injecting (0.5g) of Heroin and taking 8000 mg of Gabapentin. Pt stated he has had a lot of trauma and stressors in his life and this is why he attempted suicide. He stated he had 24 teeth extracted a week ago and has been in a lot of pain and the pain pills were not helping so he started using Heroin. 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 Pt has a strong desire to get sober and get his life  back in order.    Associated Signs/Symptoms: Depression Symptoms:  depressed mood, (Hypo) Manic Symptoms:  N/A Anxiety Symptoms:  Mild anxiety Psychotic Symptoms:  N/A PTSD Symptoms: NA Total Time spent with patient: 20 minutes  Past Psychiatric History: substance abuse, depression  Is the patient at risk to self? Yes.   substance abuse Has the patient been a risk to self in the past 6 months? Yes.  Substance abuse  Has the patient been a risk to self  within the distant past? No. Substance abuse Is the patient a risk to others? No.  Has the patient been a risk to others in the past 6 months? No.  Has the patient been a risk to others within the distant past? No.   Prior Inpatient Therapy:  Yes Prior Outpatient Therapy:  No  Substance Abuse History in the last 12 months:  Yes.   Consequences of Substance Abuse: Withdrawal Symptoms:   Cramps Diaphoresis Previous Psychotropic Medications: Yes, Serequel 200 mg QHS Psychological Evaluations: No Past Medical History:  Past Medical History:  Diagnosis Date  . Abrasion of leg 06/29/2012   abrasions bilateral leg  . Anxiety   . Arthritis    right hand  . Bipolar 1 disorder ([place])   . Dental bridge present    lower  . Dental crowns present   . Depression   . Fluctuating blood pressure    no current med.  . 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 GERD (gastroesophageal reflux disease)     Past Surgical History:  Procedure Laterality Date  . AMPUTATION  08/30/2011   Procedure: AMPUTATION DIGIT;  Surgeon: Tennis Must, MD;  Location: [place];  Service: Orthopedics;  Laterality: Right;  partial  . ARTERY REPAIR  08/30/2011   Procedure: ARTERY REPAIR;  Surgeon: Tennis Must, MD;  Location: [place];  Service: Orthopedics;  Laterality: Right;  . CARPAL TUNNEL RELEASE Right 07/03/2012   Procedure: CARPAL TUNNEL RELEASE;  Surgeon: Tennis Must, MD;  Location: [place];  Service: Orthopedics;  Laterality: Right;  . I&D EXTREMITY  08/30/2011   Procedure: IRRIGATION AND DEBRIDEMENT EXTREMITY;  Surgeon: Tennis Must, MD;  Location: [place];  Service: Orthopedics;  Laterality: Right;  . LACERATION REPAIR  08/30/2011   Procedure: REPAIR MULTIPLE LACERATIONS;  Surgeon: Tennis Must, MD;  Location: [place];  Service: Orthopedics;  Laterality: Right;  multiple fingers crush injury  . OPEN REDUCTION INTERNAL FIXATION (ORIF) FINGER WITH RADIAL BONE GRAFT Right  07/03/2012   Procedure: OPEN REDUCTION INTERNAL FIXATION (ORIF) LONG FINGER WITH BONE GRAFT;  Surgeon: Tennis Must, MD;  Location: [place];  Service: Orthopedics;  Laterality: Right;  . ORIF ANKLE FRACTURE Right    Family History: History reviewed. No pertinent family history. Family Psychiatric History: Unknown Tobacco Screening:   Social History:  History  Alcohol Use  . 7.2 oz/week  . 12 Cans of beer per week     History  Drug Use  . Frequency: 7.0 times per week  . Types: IV    Comment: 1 g a day    Additional Social History:    Allergies:  No Known Allergies Lab Results:  Results for orders placed or performed during the hospital encounter of 11/11/15 (from the past 48 hour(s))  Comprehensive metabolic panel     Status: Abnormal   Collection Time: 11/11/15  4:34 PM  Result Value Ref Range  Sodium 143 135 - 145 mmol/L   Potassium 3.7 3.5 - 5.1 mmol/L   Chloride 109 101 - 111 mmol/L   CO2 25 22 - 32 mmol/L   Glucose, Bld 121 (H) 65 - 99 mg/dL   BUN 15 6 - 20 mg/dL   Creatinine, Ser 1.16 0.61 - 1.24 mg/dL   Calcium 9.5 8.9 - 10.3 mg/dL   Total Protein 7.4 6.5 - 8.1 g/dL   Albumin 3.9 3.5 - 5.0 g/dL   AST 17 15 - 41 U/L   ALT 17 17 - 63 U/L   Alkaline Phosphatase 56 38 - 126 U/L   Total Bilirubin 0.4 0.3 - 1.2 mg/dL   GFR calc non Af Amer >60 >60 mL/min   GFR calc Af Amer >60 >60 mL/min    Comment: (NOTE) The eGFR has been calculated using the CKD EPI equation. This calculation has not been validated in all clinical situations. eGFR's persistently <60 mL/min signify possible Chronic Kidney Disease.    Anion gap 9 5 - 15  Ethanol     Status: None   Collection Time: 11/11/15  4:34 PM  Result Value Ref Range   Alcohol, Ethyl (B) <5 <5 mg/dL    Comment:        LOWEST DETECTABLE LIMIT FOR SERUM ALCOHOL IS 5 mg/dL FOR MEDICAL PURPOSES ONLY   Salicylate level     Status: None   Collection Time: 11/11/15  4:34 PM  Result Value Ref Range    Salicylate Lvl <6.2 2.8 - 30.0 mg/dL  Acetaminophen level     Status: Abnormal   Collection Time: 11/11/15  4:34 PM  Result Value Ref Range   Acetaminophen (Tylenol), Serum <10 (L) 10 - 30 ug/mL    Comment:        THERAPEUTIC CONCENTRATIONS VARY SIGNIFICANTLY. A RANGE OF 10-30 ug/mL MAY BE AN EFFECTIVE CONCENTRATION FOR MANY PATIENTS. HOWEVER, SOME ARE BEST TREATED AT CONCENTRATIONS OUTSIDE THIS RANGE. ACETAMINOPHEN CONCENTRATIONS >150 ug/mL AT 4 HOURS AFTER INGESTION AND >50 ug/mL AT 12 HOURS AFTER INGESTION ARE OFTEN ASSOCIATED WITH TOXIC REACTIONS.   cbc     Status: Abnormal   Collection Time: 11/11/15  4:34 PM  Result Value Ref Range   WBC 14.9 (H) 4.0 - 10.5 K/uL   RBC 4.63 4.22 - 5.81 MIL/uL   Hemoglobin 14.4 13.0 - 17.0 g/dL   HCT 43.4 39.0 - 52.0 %   MCV 93.7 78.0 - 100.0 fL   MCH 31.1 26.0 - 34.0 pg   MCHC 33.2 30.0 - 36.0 g/dL   RDW 14.3 11.5 - 15.5 %   Platelets 341 150 - 400 K/uL  Rapid urine drug screen (hospital performed)     Status: Abnormal   Collection Time: 11/11/15  5:11 PM  Result Value Ref Range   Opiates POSITIVE (A) NONE DETECTED   Cocaine NONE DETECTED NONE DETECTED   Benzodiazepines POSITIVE (A) NONE DETECTED   Amphetamines NONE DETECTED NONE DETECTED   Tetrahydrocannabinol NONE DETECTED NONE DETECTED   Barbiturates NONE DETECTED NONE DETECTED    Comment:        DRUG SCREEN FOR MEDICAL PURPOSES ONLY.  IF CONFIRMATION IS NEEDED FOR ANY PURPOSE, NOTIFY LAB WITHIN 5 DAYS.        LOWEST DETECTABLE LIMITS FOR URINE DRUG SCREEN Drug Class       Cutoff (ng/mL) Amphetamine      1000 Barbiturate      200 Benzodiazepine   694 Tricyclics  300 Opiates          300 Cocaine          300 THC              50   Acetaminophen level     Status: Abnormal   Collection Time: 11/11/15  8:56 PM  Result Value Ref Range   Acetaminophen (Tylenol), Serum <10 (L) 10 - 30 ug/mL    Comment:        THERAPEUTIC CONCENTRATIONS VARY SIGNIFICANTLY. A RANGE  OF 10-30 ug/mL MAY BE AN EFFECTIVE CONCENTRATION FOR MANY PATIENTS. HOWEVER, SOME ARE BEST TREATED AT CONCENTRATIONS OUTSIDE THIS RANGE. ACETAMINOPHEN CONCENTRATIONS >150 ug/mL AT 4 HOURS AFTER INGESTION AND >50 ug/mL AT 12 HOURS AFTER INGESTION ARE OFTEN ASSOCIATED WITH TOXIC REACTIONS.     Blood Alcohol level:  Lab Results  Component Value Date   ETH <5 11/11/2015   ETH <11 78/93/8101    Metabolic Disorder Labs:  No results found for: HGBA1C, MPG No results found for: PROLACTIN No results found for: CHOL, TRIG, HDL, CHOLHDL, VLDL, LDLCALC  Current Medications: Current Facility-Administered Medications  Medication Dose Route Frequency Provider Last Rate Last Dose  . chlordiazePOXIDE (LIBRIUM) capsule 25 mg  25 mg Oral Q6H PRN Ethelene Hal, NP   25 mg at 11/12/15 1331  . hydrOXYzine (ATARAX/VISTARIL) tablet 25 mg  25 mg Oral Q6H PRN Ethelene Hal, NP      . loperamide (IMODIUM) capsule 2-4 mg  2-4 mg Oral PRN Ethelene Hal, NP      . multivitamin with minerals tablet 1 tablet  1 tablet Oral Daily Ethelene Hal, NP   1 tablet at 11/12/15 1331  . nicotine (NICODERM CQ - dosed in mg/24 hours) patch 21 mg  21 mg Transdermal Daily Ethelene Hal, NP      . ondansetron (ZOFRAN-ODT) disintegrating tablet 4 mg  4 mg Oral Q6H PRN Ethelene Hal, NP      . Derrill Memo ON 11/13/2015] thiamine (VITAMIN B-1) tablet 100 mg  100 mg Oral Daily Ethelene Hal, NP      . traZODone (DESYREL) tablet 50 mg  50 mg Oral QHS PRN Ethelene Hal, NP       PTA Medications: Prescriptions Prior to Admission  Medication Sig Dispense Refill Last Dose  . FLUoxetine (PROZAC) 20 MG tablet Take 60 mg by mouth daily.   11/11/2015 at Unknown time  . gabapentin (NEURONTIN) 400 MG capsule Take 400 mg by mouth 3 (three) times daily.  1 11/11/2015 at Unknown time  . ibuprofen (ADVIL,MOTRIN) 200 MG tablet Take 400 mg by mouth every 6 (six) hours as needed.   Past  Month at Unknown time  . lisinopril (PRINIVIL,ZESTRIL) 10 MG tablet Take 10 mg by mouth daily.   11/11/2015 at Unknown time  . QUEtiapine (SEROQUEL) 200 MG tablet Take 200 mg by mouth at bedtime.   11/10/2015 at Unknown time    Musculoskeletal: Strength & Muscle Tone: within normal limits Gait & Station: normal Patient leans: N/A  Psychiatric Specialty Exam: Physical Exam  Review of Systems  Psychiatric/Behavioral: Positive for depression and substance abuse. Negative for hallucinations, memory loss and suicidal ideas. The patient is not nervous/anxious and does not have insomnia.   All other systems reviewed and are negative.   Blood pressure (!) 150/92, pulse 89, temperature 98.9 F (37.2 C), temperature source Oral, resp. rate 18, height 5' 10"  (1.778 m), weight 78.9 kg (174 lb), SpO2 100 %.Body  mass index is 24.97 kg/m.  General Appearance: Casual and Fairly Groomed   Eye Contact:  Good  Speech:  Clear and Coherent and Normal Rate  Volume:  Normal  Mood:  Anxious and Depressed  Affect:  Appropriate and Congruent  Thought Process:  Coherent  Orientation:  Full (Time, Place, and Person)  Thought Content:  WDL and Logical  Suicidal Thoughts:  No  Homicidal Thoughts:  No  Memory:  Immediate;   Good Recent;   Good Remote;   Fair  Judgement:  Fair  Insight:  Fair  Psychomotor Activity:  Normal  Concentration:  Concentration: Fair and Attention Span: Fair  Recall:  [employer] of Knowledge:  Good  Language:  Good  Akathisia:  No  Handed:  Right  AIMS (if indicated):     Assets:  Communication Skills Desire for Improvement Financial Resources/Insurance Housing Resilience Social Support  ADL's:  Intact  Cognition:  WNL  Sleep:         Treatment Plan Summary: Daily contact with patient to assess and evaluate symptoms and progress in treatment and Medication management    Observation Level/Precautions:  Continuous Observation   Medications:   Serequel 200 mg  QHS Vistaril 25 mg Q6hr PRN Gabapentin 400 mg TID Prozac 60 mg QD    Discharge Concerns:  Sobriety. Life stressors Estimated LOS:24-48 hours      Ethelene Hal, NP 10/19/20174:43 PM

## 2015-11-12 NOTE — ED Notes (Signed)
MD at bedside. 

## 2015-11-13 MED ORDER — IBUPROFEN 200 MG PO TABS: 400.0000 mg | ORAL_TABLET | Freq: Four times a day (QID) | ORAL | 0 refills | Status: AC | PRN

## 2015-11-13 MED ORDER — FLUOXETINE HCL 20 MG PO TABS: 60.0000 mg | ORAL_TABLET | Freq: Every day | ORAL | 3 refills | Status: AC

## 2015-11-13 MED ORDER — QUETIAPINE FUMARATE 200 MG PO TABS: 200.0000 mg | ORAL_TABLET | Freq: Every day | ORAL | Status: AC

## 2015-11-13 MED ORDER — ADULT MULTIVITAMIN W/MINERALS CH: 1.0000 | ORAL_TABLET | Freq: Every day | ORAL | Status: AC

## 2015-11-13 MED ORDER — GABAPENTIN 400 MG PO CAPS: 400.0000 mg | ORAL_CAPSULE | Freq: Three times a day (TID) | ORAL | 1 refills | Status: AC

## 2015-11-13 MED ORDER — LISINOPRIL 10 MG PO TABS: 10.0000 mg | ORAL_TABLET | Freq: Two times a day (BID) | ORAL | Status: AC

## 2015-11-13 NOTE — Progress Notes (Signed)
Patient gave this Clinical research associatewriter verbal permission to allow the patient to speak with Irving Burtonmily at the [facility]

## 2015-11-13 NOTE — Progress Notes (Signed)
This Clinical research associatewriter spoke with Pt about his discharge plans. Pt stated that he will be going to [facility], [place]. Pt asked this writer to use the phone to call his family. This Clinical research associatewriter asked the Pt if he would like any resources and this Pt declined any resources and stated that he will follow up with [facility] [place] and that he already has resources.

## 2015-11-13 NOTE — Discharge Summary (Signed)
[facility]-Observation Unit Discharge Summary   Admit date: 11/12/2015 Discharge date: 11/13/2015  [name] [name] is an 40 y.o. male, who [facility] Pt reported, he attempted suicide, by injecting himself with heroin and taking pills. Pt reported yesterday he had suicidal thoughts however he does not currently. Pt reported, his father is dying from cancer which is what triggered the suicide attempt. Pt denies, HI, AVH and self-injurious behaviors. Pt reported experiencing the following depressive symptoms: sadness/low mood, difficulty concentrating (pt reports at times), deceased sleep (pt reported if she does not take his sleep medication he will not sleep), isolating, and feeling hopeless.   [facility]. Per assessment on morning of 11/13/2015 the patient stated "I am not suicidal. I have not used in several days. I was not trying to hurt myself. Just having some dental pain. I'm not having any withdrawal symptoms." Patient shared his desire to discharge today. [name] [name] reported that he would be picked up by his mother and will go back [facility] [place]. He also stated "They needed to make sure I did not need detox before I returned there." Patient denied claims that he had tried to overdose on medications. Notes indicate that the RN at facility that after counting patient's neurontin pills that he had too many left to have ingested the amount reported. No signs or symptoms of withdrawal at time of assessment. The patient denied feeling depressed. He demonstrated a bright affect during the assessment. Patient reported that a PCP located near [facility] was managing his medications along with his Hypertension. [name] [name] reported that he has prescriptions for both his medical and psychiatric medications. Patient denied any active suicidal ideation several times on 11/13/2015. He declined any  resources to help with addiction per the Observation Unit staff. The patient arranged to have his mother [facility]. His was instructed to resume his prior to admission medications at reported dosages of Prozac, Seroquel, Neurontin, and Lisinopril for elevated blood pressure. He left [facility] in stable condition with all belongings returned to him.

## 2015-11-13 NOTE — Progress Notes (Signed)
This Clinical research associatewriter spoke with Pt about his follow-up plans with discharge. Pt informed this Clinical research associatewriter that his mom will be picking him up today, upon discharge to take [facility] to [facility].

## 2016-08-01 MED ORDER — FLUOXETINE 20 MG CAPSULE
ORAL_CAPSULE | 0 refills | 0 days | Status: CP
Start: 2016-08-01 — End: ?

## 2016-08-01 MED ORDER — LISINOPRIL 10 MG TABLET
ORAL_TABLET | 0 refills | 0 days | Status: CP
Start: 2016-08-01 — End: 2016-09-01

## 2016-08-02 ENCOUNTER — Ambulatory Visit: Admission: RE | Admit: 2016-08-02 | Discharge: 2016-08-02 | Disposition: A | Discharge status: Home / Self Care

## 2016-08-02 DIAGNOSIS — B1920 Unspecified viral hepatitis C without hepatic coma: Secondary | ICD-10-CM

## 2016-08-02 DIAGNOSIS — R748 Abnormal levels of other serum enzymes: Principal | ICD-10-CM

## 2016-08-02 DIAGNOSIS — R05 Cough: Secondary | ICD-10-CM

## 2016-08-28 ENCOUNTER — Emergency Department
Admission: EM | Admit: 2016-08-28 | Discharge: 2016-08-28 | Disposition: A | Discharge status: Home / Self Care | Source: Intra-hospital | Attending: Emergency Medicine | Admitting: Emergency Medicine

## 2016-08-28 MED ORDER — SULFAMETHOXAZOLE 400 MG-TRIMETHOPRIM 80 MG TABLET
ORAL_TABLET | Freq: Two times a day (BID) | ORAL | 0 refills | 0 days | Status: CP
Start: 2016-08-28 — End: 2016-09-07

## 2016-09-01 MED ORDER — LISINOPRIL 10 MG TABLET
ORAL_TABLET | 0 refills | 0 days | Status: CP
Start: 2016-09-01 — End: ?

## 2016-09-21 ENCOUNTER — Ambulatory Visit
Admission: RE | Admit: 2016-09-21 | Discharge: 2016-09-21 | Disposition: A | Discharge status: Home / Self Care | Attending: Gastroenterology | Admitting: Gastroenterology

## 2016-09-21 DIAGNOSIS — B1920 Unspecified viral hepatitis C without hepatic coma: Principal | ICD-10-CM

## 2016-09-21 LAB — COMPREHENSIVE METABOLIC PANEL
ALBUMIN: 4.2 g/dL (ref 3.5–5.0)
ALKALINE PHOSPHATASE: 62 U/L (ref 38–126)
ALT (SGPT): 41 U/L (ref 19–72)
ANION GAP: 11 mmol/L (ref 9–15)
AST (SGOT): 26 U/L (ref 19–55)
BILIRUBIN TOTAL: 0.7 mg/dL (ref 0.0–1.2)
BLOOD UREA NITROGEN: 16 mg/dL (ref 7–21)
BUN / CREAT RATIO: 17
CALCIUM: 9.4 mg/dL (ref 8.5–10.2)
CO2: 25 mmol/L (ref 22.0–30.0)
CREATININE: 0.96 mg/dL (ref 0.70–1.30)
EGFR MDRD NON AF AMER: >=60 mL/min/{1.73_m2} (ref >=60)
GLUCOSE RANDOM: 90 mg/dL (ref 65–179)
POTASSIUM: 4.5 mmol/L (ref 3.5–5.0)
PROTEIN TOTAL: 7.6 g/dL (ref 6.5–8.3)
SODIUM: 143 mmol/L (ref 135–145)

## 2016-09-21 LAB — CBC W/ AUTO DIFF
BASOPHILS ABSOLUTE COUNT: 0.1 10*9/L (ref 0.0–0.1)
HEMATOCRIT: 40.6 % — ABNORMAL LOW (ref 41.0–53.0)
HEMOGLOBIN: 14 g/dL (ref 13.5–17.5)
LARGE UNSTAINED CELLS: 2 % (ref 0–4)
LYMPHOCYTES ABSOLUTE COUNT: 2.2 10*9/L (ref 1.5–5.0)
MEAN CORPUSCULAR HEMOGLOBIN CONC: 34.5 g/dL (ref 31.0–37.0)
MEAN CORPUSCULAR VOLUME: 92.6 fL (ref 80.0–100.0)
MEAN PLATELET VOLUME: 8.6 fL (ref 7.0–10.0)
MONOCYTES ABSOLUTE COUNT: 0.4 10*9/L (ref 0.2–0.8)
NEUTROPHILS ABSOLUTE COUNT: 3.1 10*9/L (ref 2.0–7.5)
PLATELET COUNT: 293 10*9/L (ref 150–440)
RED BLOOD CELL COUNT: 4.39 10*12/L — ABNORMAL LOW (ref 4.50–5.90)
RED CELL DISTRIBUTION WIDTH: 13.6 % (ref 12.0–15.0)
WBC ADJUSTED: 6 10*9/L (ref 4.5–11.0)

## 2016-09-21 LAB — BILIRUBIN DIRECT: Bilirubin.glucuronidated:MCnc:Pt:Ser/Plas:Qn:: 0.4

## 2016-09-21 LAB — BLOOD UREA NITROGEN: Urea nitrogen:MCnc:Pt:Ser/Plas:Qn:: 16

## 2016-09-21 LAB — RED CELL DISTRIBUTION WIDTH: Lab: 13.6

## 2016-09-21 NOTE — Unmapped (Addendum)
1. Blood work today to assess you liver function, and see if you are immune to hepatitis A and B  2. Twinrix vaccine today  3. We'll have you speak with our pharmacist to discuss treatment options for treatment of hepatitis C  4. Return to clinic in 3 months  5. We'll do an ultrasound prior to your next appointment

## 2016-09-21 NOTE — Unmapped (Addendum)
[facility]  FAST ??? Fibrosis Assessment Team  Division of Gastroenterology and Hepatology  ??  ??    ??  FIBROSCAN will be performed to assess hepatic fibrosis (scarring) in order to stage this patient's liver disease. This will assist with evaluating the natural course of the disease and will provide important information regarding prognosis, duration of therapy, and potential response to treatment. This information will also help assess risk for hepatocellular carcinoma and need for liver cancer surveillance.??  ??  FibroscanProcedure:   After obtaining verbal consent, the patient was placed in a supine position. Physical characteristics and landmarks were assessed to establish appropriate mid-axillary intercostal space for probe placement. 50Hz  Shear Wave pulses were applied and the resulting Shear Wave and Propagation Speed detected with a 3.5 MHz ultrasonic signal, using the FibroScan probe.  Skin to liver capsule distance and liver parenchyma were accessed during the entire examination with the FibroScan probe. The patient was instructed to breathe normally and to abstain from sudden movements during the procedure, allowing for random measurements of liver stiffness. At least ten Shear Waves were produced; individual measurements of each Shear Wave were calculated. Patient tolerated the procedure well and was discharged without incident.  ??  Probe used [x]  M+    Serial # [id number]                         []  XL+   Serial # [id number]  ??  Main Etiology of Liver Disease:  [x] HCV     [] HBV   [] Alcohol    [] NASH  [] PBC      [] PSC     [] Other________________  ??  50Hz  shear wave pulses were applied and the resulting shear wave and propagation speed detected with a 3.5 MHz ultrasonic signal, using the FibroScan probe.     ??  At least ten Shear Waves were produced; individual measurements of each shear wave were calculated.    ??  Patient tolerated the procedure well and was discharged without incident.  ??  Fibroscan score: ___5.9___kPa  ??  IQR:                       ___10___%  ??  Test performed by: Luiz Ochoa, RN  ??  [facility]  FAST ??? Fibrosis Assessment Team  Division of Gastroenterology and Hepatology  ??  ??    ??  ??  ??  Estimation of the stage of liver fibrosis (Metavir Score):  The results of the Liver Stiffness Score are consistent with the following liver fibrosis stage:  ??  ??  [x]  F0-F1             []  F2               []  F3               []  F4  ??  ??  GENERAL RECOMMENDATIONS ACCORDING TO THE STAGE OF LIVER FIBROSIS.  ??  F0-F1: No-minimal fibrosis. The risk of progression to advanced fibrosis and cirrhosis is low. If the cause of liver disease is not removed, a 1-2 yr follow-up study is recommended.   F2: Significant fibrosis. There is a moderate risk of progression to cirrhosis. If the cause of liver disease is not removed, a follow-up study in 12 months is recommended.  F3: Advanced (pre-cirrhotic stage). The risk of progression to cirrhosis is high. Imaging studies to rule out hepatocellular carcinoma  should be considered. Efforts to remove the cause of liver disease are highly recommended.  F4: Cirrhosis. There is significant risk of portal hypertension and esophageal varices. An upper endoscopy is recommended. Imaging studies for hepatocellular carcinoma screening are recommended.   ??  Any and all FibroScan studies must be carefully evaluated, taking fully into account all individual measurement/scans, patient history and other factors.  As with liver biopsy, any estimation of liver fibrosis may be subject to under or over staging due to sampling error.  Any further medical or surgical intervention should be made only while fully considering the circumstances of this patient and in consultation with this patient.    I have reviewed and interpreted the FIBROSCAN test results as described above.  ??  Tyrone Dalton, M.D.  Professor of Medicine  Director, [facility]  [facility] [place] [facility] [place] [phone]

## 2016-09-21 NOTE — Unmapped (Signed)
[facility] CONSULTATION VISIT    REFERRING PROVIDER: Elson Clan, NP  [address]  [place]  [place], [place] [address]    PRIMARY CARE PROVIDER: Gilles Chiquito, MD    SUBJECTIVE:     REASON FOR VISIT: chronic hepatitis C     HISTORY OF PRESENT ILLNESS:      Tyrone Dalton is a 41 y.o. male (DOB: 05-May-1975) who is seen in consultation at the request of Dr. Katherine Roan for evaluation of chronic hepatitis C. 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  He acquired hepatitis C through IVDU, which was diagnosed 4 years ago on screening blood work at his PCPs office. He is a genotype 3, and is treatment naive.   Denies any symptoms of liver disease, including yellowing of the eyes, skin, easy bruising, bleeding.   Denies any symptoms of advance liver disease including confusion, abdominal pain, ascites, lower extremity edema, hematemesis, or melena.  Has always had normal transaminases on routine blood work, and normal synthetic function, with exception of elevated transaminases noted on blood work 1 month ago (AST 260, ALT 479) which he attributes to taking excessive analgesics, and goody powders for a shoulder injury. Transaminases have since trended down, most current from 07/2016 are 50 and 73, but previously 17, 19.    REVIEW OF SYSTEMS:   The balance of 12 systems reviewed is negative except as noted in the HPI.     PAST MEDICAL HISTORY:  Past Medical History:   Diagnosis Date   ??? Anxiety    ??? Depression    ??? Essential hypertension 10/29/2015   ??? Hepatitis C    ??? Heroin abuse    ??? Substance induced mood disorder (CMS-HCC)    ??? Suicidal ideation      Patient Active Problem List    Diagnosis Date Noted   ??? Hepatitis C virus infection without hepatic coma 07/25/2016   ??? Elevated liver enzymes 07/25/2016   ??? Abscess of face 03/24/2016   ??? Essential hypertension 10/29/2015   ??? Substance induced mood disorder (CMS-HCC) 09/17/2015   ??? Opioid dependence (CMS-HCC) 10/07/2013        PAST SURGICAL HISTORY:  Past Surgical History:   Procedure Laterality Date   ??? ANKLE SURGERY     ??? HAND SURGERY     ??? KNEE SURGERY         MEDICATIONS:  Current Outpatient Prescriptions   Medication Sig Dispense Refill   ??? FLUoxetine (PROZAC) 20 MG capsule TAKE 3 CAPSULES BY MOUTH EVERY DAY 90 capsule 0   ??? gabapentin (NEURONTIN) 300 MG capsule Take 2 capsules (600 mg total) by mouth Three (3) times a day. 180 capsule 2   ??? lisinopril (PRINIVIL,ZESTRIL) 10 MG tablet TAKE 1 TABLET BY MOUTH EVERY DAY 90 tablet 0   ??? QUEtiapine (SEROQUEL) 200 MG tablet Take 1 tablet (200 mg total) by mouth nightly. 30 tablet 3     No current facility-administered medications for this visit.          ALLERGIES:  Patient has no known allergies.    FAMILY HISTORY:  Mother with bipolar disorder  Father deceased, has parkinson's  disease  No history of liver disease, liver cancer.  No history of colon cancer, or other malignancy.    SOCIAL HISTORY:  Social History   Substance Use Topics   ??? Smoking status: Current Every Day Smoker     Packs/day: 0.50     Types: Cigarettes   ??? Smokeless tobacco: Never Used      Comment: printed info given   ??? Alcohol use No       OBJECTIVE:   VITAL SIGNS: BP 143/95  - Pulse 70  - Temp 37.1 ??C (Oral)  - Resp 14  - Ht 180.3 cm (5' 10.98)  - Wt 99.2 kg (218 lb 12.8 oz)  - SpO2 (!) 70%  - BMI 30.53 kg/m??     PHYSICAL EXAM:  Constitutional: Alert, Oriented x 3, No acute distress, well nourished and well hydrated.   HEENT: EOMI, conjunctiva clear, anicteric, oropharynx clear, neck supple  CV: Regular rate and rhythm, normal S1, S2. No murmurs.   Lung: Clear to auscultation bilaterally. Unlabored breathing.   Abdomen: soft, normal bowel sounds, non-distended, non-tender. No organomegaly. No ascites.   Extremities: No edema, well perfused  MSK: No joint swelling or tenderness noted, R fifth digit surgically amputated.  Skin: No rashes, jaundice or skin lesions noted. Small scars on face.  Neuro: No focal deficits. No asterixis.   Mental Status: Thought organized, appropriate affect, pleasantly interactive, not anxious appearing    DIAGNOSTIC STUDIES:  I have reviewed all pertinent diagnostic studies, including:    GI Procedures:  Fibroscan 09/21/2016 5.9kPa, IQR% 10    Radiographic studies:  None.    Laboratory results:  Lab Results   Component Value Date    WBC 6.1 08/02/2016    HGB 15.2 08/02/2016    HCT 43.3 08/02/2016    PLT 251 08/02/2016     Lab Results   Component Value Date    NA 141 06/15/2016    K 4.2 06/15/2016    CL 99 06/15/2016    CO2 24.0 06/15/2016    BUN 15 06/15/2016    CREATININE 1.00 06/15/2016    CALCIUM 9.3 06/15/2016     Lab Results   Component Value Date    ALKPHOS 57 08/02/2016    BILITOT 0.5 08/02/2016    BILIDIR <0.20 08/02/2016    PROT 7.5 08/02/2016    ALBUMIN 4.5 08/02/2016    ALT 73 (H) 08/02/2016    AST 50 (H) 08/02/2016     Lab Results   Component Value Date    INR 1.00 01/05/2013     Genotype: 3 (08/02/16)  HCV RNA: 38,961 IU/ml (08/02/16)     Results for orders placed or performed in visit on 09/21/16   Comprehensive Metabolic Panel (Chem 14)   Result Value Ref Range    Sodium 143 135 - 145 mmol/L    Potassium 4.5 3.5 - 5.0 mmol/L    Chloride 107 98 - 107 mmol/L    CO2 25.0 22.0 - 30.0 mmol/L    BUN 16 7 - 21 mg/dL    Creatinine 1.61 0.96 - 1.30 mg/dL    BUN/Creatinine Ratio 17     EGFR MDRD Non Af Amer >=60 >=60 mL/min/1.37m2    EGFR MDRD Af Amer >=60 >=60 mL/min/1.80m2    Anion Gap 11 9 - 15 mmol/L    Glucose 90 65 - 179 mg/dL    Calcium 9.4 8.5 - 04.5 mg/dL    Albumin 4.2 3.5 - 5.0 g/dL  Total Protein 7.6 6.5 - 8.3 g/dL    Total Bilirubin 0.7 0.0 - 1.2 mg/dL    AST 26 19 - 55 U/L    ALT 41 19 - 72 U/L    Alkaline Phosphatase 62 38 - 126 U/L   Bilirubin, Direct   Result Value Ref Range    Bilirubin, Direct 0.40 0.00 - 0.40 mg/dL   Hepatitis A IgG   Result Value Ref Range    Hepatitis A IgG Nonreactive Nonreactive   Hepatitis B Surface Antibody   Result Value Ref Range    Hep B S Ab Nonreactive Nonreactive, [place]zone    Hepatitis B Surface Ab Quant <8.00 <8.00 m(IU)/mL   Hepatitis B Core Antibody, total   Result Value Ref Range    Hep B Core Total Ab Nonreactive Nonreactive   CBC w/ Differential   Result Value Ref Range    WBC 6.0 4.5 - 11.0 10*9/L    RBC 4.39 (L) 4.50 - 5.90 10*12/L    HGB 14.0 13.5 - 17.5 g/dL    HCT 16.1 (L) 09.6 - 53.0 %    MCV 92.6 80.0 - 100.0 fL    MCH 32.0 26.0 - 34.0 pg    MCHC 34.5 31.0 - 37.0 g/dL    RDW 04.5 40.9 - 81.1 %    MPV 8.6 7.0 - 10.0 fL    Platelet 293 150 - 440 10*9/L    Absolute Neutrophils 3.1 2.0 - 7.5 10*9/L    Absolute Lymphocytes 2.2 1.5 - 5.0 10*9/L    Absolute Monocytes 0.4 0.2 - 0.8 10*9/L    Absolute Eosinophils 0.1 0.0 - 0.4 10*9/L    Absolute Basophils 0.1 0.0 - 0.1 10*9/L    Large Unstained Cells 2 0 - 4 %         ASSESSMENT AND PLAN:     Tyrone Dalton is a 41 y.o. 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 Currently abstinent for 10 months.   No symptoms of liver disease. Has always had normal transaminases on routine blood work, and normal synthetic function, with exception of elevated transaminases noted on blood work 1 month ago (AST 260, ALT 479) which he attributes to taking excessive analgesics, and goody powders for a shoulder injury. Transaminases have since trended down, most current from 07/2016 are 50 and 73, but previously 17, 19.    Plan:  - will recheck CBC with diff, BMP, and LFTs today  - will check for immunity and exposure to hepatitis A, B  - will give first dose of Twinrix vaccine  - abdominal ultrasound ordered  - Discussed initiation of Mavyret (glecaprevir/pibrentasvir 100/40 mg) 3 tabs daily x 8 weeks, following counseling with PharmD    This patient was seen and examined, along with Attending, Dr. Duwayne Heck, MD  Gastroenterology Fellow  [place] [facility] [place] at [facility]    I have seen and examined this patient with the Resident.  I have interviewed and examined the patient, discussed the findings with the Resident, and discussed the plan of care.  I agree with the assessment and plan as described.     Alba Destine, M.D.  Professor of Medicine  Director, [facility]  [place] [facility] [place] at [facility]

## 2016-09-21 NOTE — Unmapped (Signed)
Counseling for HCV treatment     B18.2 Hep C: yes    K74.60 Cirrhosis: no,   Child Pugh Score if applicable and for Medicaid pts: N/A  Z94.4 Liver Transplant: no    Genotype: 3 (08/02/16)  HCV RNA: 38,961 IU/ml (08/02/16)   Fibrosis score: 5.9 kPa F0-F1 (09/21/16)  HIV Co-infection? no  Signs of liver decompensation? no  Previous treatment? naive    Planned regimen: Mavyret (glecaprevir/pibrentasvir 100/40 mg) 3 tabs daily x 8 weeks  Urgency: Routine Request    Prescribing Provider/NPI: Dr. Gavin Potters / [id number]  Signature waiver form not obtained at this time.   Insurance: None, [employer] Patient Assistance Program (MPAP) application with patient.     Tyrone Dalton is 41 y.o. male who presents to clinic alone and is interested in starting treatment with Mavyret. We discussed the authorization process of obtaining the medication through MPAP and that this may take some time. Stressed importance of being able to be reached by phone. Patient currently lives in ministry rehabilitation program and is on track to graduate in October. Patient does not have a phone while in the program, but provided contact information for his mother and sponsors who can reach him at [employer]. Gives permission to speak to his Mother, Tyrone Dalton [phone] .     Current medications:  Current Outpatient Prescriptions   Medication Sig Dispense Refill   ??? FLUoxetine (PROZAC) 20 MG capsule TAKE 3 CAPSULES BY MOUTH EVERY DAY 90 capsule 0   ??? gabapentin (NEURONTIN) 300 MG capsule Take 2 capsules (600 mg total) by mouth Three (3) times a day. 180 capsule 2   ??? lisinopril (PRINIVIL,ZESTRIL) 10 MG tablet TAKE 1 TABLET BY MOUTH EVERY DAY 90 tablet 0   ??? QUEtiapine (SEROQUEL) 200 MG tablet Take 1 tablet (200 mg total) by mouth nightly. 30 tablet 3     No current facility-administered medications for this visit.        Following topics were discussed during counseling:    1. Indications for medication, dosage and administration.     A. Mavyret (100/40 mg) 3 tablets to take daily with food.    2. Common side effects of medications and management strategies. (fatigue, headache)    3. Importance of adherence to regimen, follow-up clinic visits and lab monitoring.     A. Asked patient to call Rodman Kras [phone] to establish start date for treatment and to schedule appointment 4 weeks before starting treatment.    4. Drug-drug interaction.    A. Current medications have been reviewed and assessed for possible interaction.    - Seroquel: Advised of potential increase in Seroquel concentration which may lead to increased adverse effects. Pt takes this for sleep and currently not experiencing any side effects. Will need to monitor for increased adverse effects once starts.    B.  Denies use of herbal medication such as milk thistle or St. John's wort. Allergies have been verified.    5. Importance of informing pharmacy and clinic of updated contact information.  Discussed the process of obtaining medication through specialty pharmacy and when approved medication will be delivered to patient's home.     Patient verbalized understanding. Provided contact information for any questions/concerns.     Leonarda Salon, PharmD  Pharmacy Resident    Park Breed, Pharm D., BCPS, CGP, CPP  [facility]  [address]  [place], [place] [address]  [phone]

## 2016-09-22 LAB — HEPATITIS A IGG: Hepatitis A virus Ab.IgG:PrThr:Pt:Ser:Ord:: NONREACTIVE

## 2016-09-22 LAB — HEPATITIS B CORE TOTAL ANTIBODY: Hepatitis B virus core Ab:PrThr:Pt:Ser/Plas:Ord:IA: NONREACTIVE

## 2016-09-22 LAB — HEPATITIS B SURFACE ANTIBODY QUANT: Hepatitis B virus surface Ab:ACnc:Pt:Ser:Qn:: <8

## 2016-09-22 NOTE — Unmapped (Signed)
I have reviewed and interpreted the FIBROSCAN test results as described above.    Alba Destine, M.D.  Professor of Medicine  Director, [facility]  [place] [facility] [place] at [facility]

## 2016-09-27 MED ORDER — GLECAPREVIR 100 MG-PIBRENTASVIR 40 MG TABLET: 3 | carton | Freq: Every day | 1 refills | 0 days | Status: AC

## 2016-09-27 MED ORDER — GLECAPREVIR 100 MG-PIBRENTASVIR 40 MG TABLET
Freq: Every day | ORAL | 1 refills | 0.00000 days | Status: CP
Start: 2016-09-27 — End: 2016-09-27
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
## 2016-11-09 NOTE — Unmapped (Signed)
Addended by: Claud Kelp on: 11/09/2016 01:41 PM     Modules accepted: Orders

## 2016-11-09 NOTE — Unmapped (Signed)
Reason for call: Pending patient's portion of MFR Assistance Application for Mavyret    Hep C Genotype: 3 (08/02/16)  Planned Regimen: Mavyret x 8 weeks  Fibrosis:  5.9 kPa F0-F1 (09/21/16)    [facility] Assistance Program (PAP) has made many attempts to reach patient regarding the return of patient's portion of [facility] Assistance application. Called patient at phone number [phone] and left VM with request for patient to return call. Also left VM at phone number [phone]. Contact information provided.     Vertell Limber RN, BSN  Nursing Care Coordinator   Pharmacy Adult GI Medicine  [facility]  [address]   [place], [place] [address]  [phone]    Park Breed, Pharm D., BCPS, BCGP, CPP  [facility]  [address]  [place], [place] [address]  [phone]

## 2016-11-11 NOTE — Unmapped (Signed)
Reason for call: Patient returned call to follow up on ultrasound ordered   ??  Hep C   Genotype: 3 (08/02/16)  Planned Regimen: Mavyret x 8 weeks  Fibrosis: ??5.9 kPa F0-F1 (09/21/16)??    Patient returned call to the office. Notified patient that he has been rescheduled for an abdominal ultrasound on 11/28/16 at 0845 at the [facility] location. Provided address to patient. Patient was instructed to be NPO for 6 hrs prior to ultrasound. Patient verbalized understanding. Patient also notified of appointment scheduled with Provider on 12/22/16 at 1430.     Vertell Limber RN, BSN  Nursing Care Coordinator   Pharmacy Adult GI Medicine  [facility]  [address]   [place], [place] [address]  [phone]

## 2016-11-21 NOTE — Unmapped (Signed)
Reason for call: Pending patient's portion of MFR Assistance Application for Mavyret  ??  Hep C   Genotype: 3 (08/02/16)  Planned Regimen: Mavyret x 8 weeks  Fibrosis:??5.9 kPa F0-F1 (09/21/16)    Patient called office to reschedule his ultrasound and appointment with Provider. Patient stated he is going into a 30 day treatment program and will not be able to leave for any appointments. Patient stated he will start treatment on Nov 1st and end on Dec 1st. [facility] rescheduled patient's ultrasound for Dec 13th at 0930 in [place], and patient has been rescheduled with Provider on Jan 5th at 10:00. Patient stated he mailed in his [facility] Assistance Application yesterday. Patient would like his mother to be called and notified while he is in treatment. Patient's mother is Harriett Sine and can be reached at [phone].      Vertell Limber RN, BSN  Nursing Care Coordinator   Pharmacy Adult GI Medicine  [facility]  [address]   [place], [place] [address]  [phone]

## 2017-01-03 NOTE — Unmapped (Signed)
Reason for call: Reminder call for ultrasound on 01/05/17 at 0915     Hep C   Genotype: 3 (08/02/16)  Planned Regimen: Mavyret x 8 weeks  Fibrosis:??5.9 kPa F0-F1 (09/21/16)    Called and spoke with patient at phone number [phone]. Patient stated he just got out of the treatment program and came to his mother's home on 12/22/16. Reminded patient that he has an ultrasound scheduled on 01/05/17. Notified patient he will need to be NPO for 6 hrs prior to ultrasound. Patient verbalized understanding. Notified patient we never received his portion of the [facility] Assistance Application. Notified patient I would mail him a new application in the mail. Also will send an application for [facility] as patient does not have any insurance. Patient thanked me for the assistance. Notified patient to bring the applications at his next appointment on 02/07/17 with Provider. Patient verbalized understanding.       Vertell Limber RN, BSN  Nursing Care Coordinator   Pharmacy Adult GI Medicine  [facility]  [address]   [place], [place] [address]  [phone]

## 2017-02-06 NOTE — Unmapped (Signed)
Reason for call: Reminder call for appointment scheduled on 02/07/17 at 10:00    Hep C   Genotype: 3 (08/02/16)  Planned Regimen: Mavyret x 8 weeks  Fibrosis:??5.9 kPa F0-F1 (09/21/16)    Reminder call placed to patient at phone number [phone]. Left VM with appointment date and time and address. Provided contact information if patient has any questions. Patient will need to complete new application for [facility] and Mfr assistance for Mavyret.     Vertell Limber RN, BSN  Nursing Care Coordinator   Pharmacy Adult GI Medicine  [facility]  [address]   [place], [place] [address]  [phone]

## 2017-02-15 NOTE — Unmapped (Signed)
Reason for call: Patient canceled his appointment scheduled on 02/07/17 @ 10:00  ??  Hep C   Genotype: 3 (08/02/16)  Planned Regimen: Mavyret x 8 weeks  Fibrosis:??5.9 kPa F0-F1 (09/21/16)??    Patient canceled his appointment scheduled on 02/07/17 and was a no show to ultrasound scheduled on 01/31/17. Called patient at  phone number [phone] and left VM with request for patient to return call. Contact information provided. Patient will need to complete new application for [facility] and Mfr assistance for Mavyret.     Vertell Limber RN, BSN  Nursing Care Coordinator   Pharmacy Adult GI Medicine  [facility]  [address]   [place], [place] [address]  [phone]

## 2017-03-08 NOTE — Unmapped (Signed)
Reason for call: Patient canceled his appointment scheduled on 02/07/17 @ 10:00  ??  Hep C   Genotype: 3 (08/02/16)  Planned Regimen: Mavyret x 8 weeks  Fibrosis:??5.9 kPa F0-F1 (09/21/16)??    Patient has canceled multiple appointments and ultrasounds. Called patient at phone number [phone] to coordinate new appointment and ultrasound. Patient also needs to complete Mfr assistance application for Mavyret and [facility]. Left a VM with request for patient to return call. Contact information provided.       Vertell Limber RN, BSN  Nursing Care Coordinator   Pharmacy Adult GI Medicine  [facility]  [address]   [place], [place] [address]  [phone]

## 2017-04-13 NOTE — Unmapped (Signed)
Reason for call: Patient canceled his appointment scheduled on 02/07/17  ??  Hep C   Genotype: 3 (08/02/16)  Planned Regimen: Mavyret x 8 weeks  Fibrosis:??5.9 kPa F0-F1 (09/21/16)??  ??  Patient has canceled multiple appointments and ultrasounds. Made another attempt to reach patient at phone number [phone] to coordinate new appointment and ultrasound. Patient also needs to complete Mfr assistance application for Mavyret and [facility]. Left a VM with request for patient to return call. Contact information provided.       Vertell Limber RN, BSN  Nursing Care Coordinator   Pharmacy Adult GI Medicine  [facility]  [address]   [place], [place] [address]  [phone]

## 2017-05-30 NOTE — Unmapped (Signed)
Reason for call: Patient canceled his appointment scheduled on 02/07/17  ??  Hep C   Genotype: 3 (08/02/16)  Planned Regimen: Mavyret x 8 weeks  Fibrosis:??5.9 kPa F0-F1 (09/21/16)??  ??  Patient has canceled multiple appointments and ultrasounds. Made another attempt to reach patient at phone number [phone] to coordinate new appointment and ultrasound. Patient also needs to complete Mfr assistance application for Mavyret and [facility]. Left another VM with request for patient to return call. Contact information provided.       Vertell Limber RN, BSN  Nursing Care Coordinator   Pharmacy Adult GI Medicine  [facility]  [address]   [place], [place] [address]  [phone]

## 2017-07-25 NOTE — Unmapped (Addendum)
Reason for call: Patient canceled his appointment scheduled on 02/07/17  ??  Hep C   Genotype: 3 (08/02/16)  Planned Regimen: Mavyret x 8 weeks  Fibrosis:??5.9 kPa F0-F1 (09/21/16)??    Multiples attempts have been made to reach pt to reschedule appointment and assist with [facility] and Mfr assistance for Mavyret. Made another attempt to reach pt at his mother's phone number [phone]. Left a VM request to have her son return call. Contact information provided.       Vertell Limber RN, BSN  Nursing Care Coordinator   Pharmacy Adult GI Medicine  [facility]  [address]   [place], [place] [address]  [phone]

## 2017-10-11 NOTE — Unmapped (Signed)
Reason for call: Patient canceled his appointment scheduled on 02/07/17  ??  Hep C   Genotype: 3 (08/02/16)  Planned Regimen: Mavyret x 8 weeks  Fibrosis:??5.9 kPa F0-F1 (09/21/16)??  ??  Multiples attempts have been made to reach pt to reschedule appointment and assist with [facility] and Mfr assistance for Mavyret. Made another attempt to reach pt at phone number [phone]. Left a VM request for pt to return call. Contact information provided.     Vertell Limber RN, BSN  Nursing Care Coordinator   Pharmacy Adult GI Medicine  [facility]  [address]   [place], [place] [address]  [phone]
